# Patient Record
Sex: Male | Born: 1960
Health system: Southern US, Community
[De-identification: ages and names within clinical notes are randomized; demographics above are authoritative.]

## PROBLEM LIST (undated history)

## (undated) DIAGNOSIS — I10 Essential (primary) hypertension: Secondary | ICD-10-CM

## (undated) DIAGNOSIS — E039 Hypothyroidism, unspecified: Secondary | ICD-10-CM

## (undated) DIAGNOSIS — Z87442 Personal history of urinary calculi: Secondary | ICD-10-CM

## (undated) DIAGNOSIS — K5090 Crohn's disease, unspecified, without complications: Secondary | ICD-10-CM

## (undated) HISTORY — DX: Hypothyroidism, unspecified: E03.9

## (undated) HISTORY — DX: Crohn's disease, unspecified, without complications: K50.90

## (undated) HISTORY — PX: BOWEL RESECTION: SHX1257

## (undated) HISTORY — DX: Essential (primary) hypertension: I10

## (undated) HISTORY — PX: LAPAROSCOPIC LYSIS INTESTINAL ADHESIONS: SUR778

---

## 2010-01-30 ENCOUNTER — Ambulatory Visit: Payer: Self-pay | Admitting: Family Medicine

## 2010-01-30 DIAGNOSIS — I10 Essential (primary) hypertension: Secondary | ICD-10-CM | POA: Insufficient documentation

## 2010-01-30 DIAGNOSIS — E039 Hypothyroidism, unspecified: Secondary | ICD-10-CM

## 2010-01-30 DIAGNOSIS — K5090 Crohn's disease, unspecified, without complications: Secondary | ICD-10-CM

## 2010-01-30 HISTORY — DX: Hypothyroidism, unspecified: E03.9

## 2010-01-30 HISTORY — DX: Essential (primary) hypertension: I10

## 2010-01-30 HISTORY — DX: Crohn's disease, unspecified, without complications: K50.90

## 2010-01-30 LAB — CONVERTED CEMR LAB
Benzodiazepines.: NEGATIVE
Creatinine,U: 68.9 mg/dL
Hep B Core Total Ab: NEGATIVE
Methadone: NEGATIVE
Propoxyphene: NEGATIVE

## 2010-02-06 ENCOUNTER — Telehealth: Payer: Self-pay | Admitting: Family Medicine

## 2010-02-21 ENCOUNTER — Telehealth: Payer: Self-pay | Admitting: Family Medicine

## 2010-02-24 ENCOUNTER — Ambulatory Visit: Payer: Self-pay | Admitting: Family Medicine

## 2010-02-24 LAB — CONVERTED CEMR LAB
ALT: 18 units/L (ref 0–53)
AST: 23 units/L (ref 0–37)
Albumin: 4 g/dL (ref 3.5–5.2)
BUN: 7 mg/dL (ref 6–23)
Bilirubin Urine: NEGATIVE
Chloride: 107 meq/L (ref 96–112)
Cholesterol: 157 mg/dL (ref 0–200)
Eosinophils Relative: 1.8 % (ref 0.0–5.0)
Glucose, Bld: 89 mg/dL (ref 70–99)
Glucose, Urine, Semiquant: NEGATIVE
HCT: 42.7 % (ref 39.0–52.0)
Hemoglobin: 14.3 g/dL (ref 13.0–17.0)
Lymphs Abs: 0.7 10*3/uL (ref 0.7–4.0)
MCV: 96.9 fL (ref 78.0–100.0)
Monocytes Absolute: 0.4 10*3/uL (ref 0.1–1.0)
Neutro Abs: 3.4 10*3/uL (ref 1.4–7.7)
PSA: 0.34 ng/mL (ref 0.10–4.00)
Platelets: 218 10*3/uL (ref 150.0–400.0)
Potassium: 4.5 meq/L (ref 3.5–5.1)
RDW: 12.6 % (ref 11.5–14.6)
Sodium: 143 meq/L (ref 135–145)
TSH: 0.97 microintl units/mL (ref 0.35–5.50)
Total Bilirubin: 0.8 mg/dL (ref 0.3–1.2)
WBC: 4.6 10*3/uL (ref 4.5–10.5)
pH: 5

## 2010-03-06 ENCOUNTER — Ambulatory Visit: Payer: Self-pay | Admitting: Family Medicine

## 2010-03-13 ENCOUNTER — Telehealth: Payer: Self-pay | Admitting: Family Medicine

## 2010-06-04 ENCOUNTER — Telehealth: Payer: Self-pay | Admitting: Family Medicine

## 2011-01-20 NOTE — Assessment & Plan Note (Signed)
Summary: TO BE EST/PRE-EMPLOYMENT CPX/NJR   Vital Signs:  Patient profile:   50 year old male Height:      71.75 inches Weight:      205 pounds BMI:     28.10 Temp:     98.2 degrees F oral Pulse rate:   60 / minute Pulse rhythm:   regular Resp:     12 per minute BP sitting:   120 / 84  (left arm) Cuff size:   regular  Vitals Entered By: Nira Conn LPN (January 30, 7370 11:24 AM) CC: New to establish, pre-employment physical  Vision Screening:Left eye w/o correction: 20 / 20 Right Eye w/o correction: 20 / 20 Both eyes w/o correction:  20/ 20  Color vision testing: normal      Vision Entered By: Nira Conn LPN (January 30, 625 11:44 AM)   History of Present Illness: Patient here to establish care. Needs preemployment physical. 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  recently moved here from [place].  Past medical history reviewed. History of Crohn's disease with 3 prior surgeries back in the 1990s. Disease been very stable on Imuran and Asacol. Has not yet established with a gastroenterologist here. Last colonoscopy 2010. Occasional loose stools, no recent diarrhea. Weight and appetite stable.  Also history of hypertension treated with Diovan HCTZ. This is been stable.  Hypothyroidism treated with generic thyroxine 50 micrograms daily.  Family history reviewed significant for father having type 2 diabetes, hypertension, chronic kidney disease related to his diabetes and heart disease in his late 41s. Mother had breast cancer  Patient smokes about half-pack cigarettes per day. Rare alcohol use. 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 Current Smoker Alcohol use-yes Regular exercise-no  Risk Factors: Exercise: no (01/30/2010)  Risk Factors: Smoking Status: current (01/30/2010) Packs/Day: 0.75 (01/30/2010)  Past Medical History: Hypertension Hypothyroid Crohn's diasease  Past Surgical History: Bowel resection for Crohn's disease X 3, 1992, 1995  Family History: Family History of Arthritis Breast cancer, mother Heart disease, father Family History Hypertension, COPD, Emphasema, father Diabetes, father, type ll  Social History: 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    Physical Exam  General:  Well-developed,well-nourished,in no acute distress; alert,appropriate and cooperative throughout examination Eyes:  No corneal or conjunctival inflammation noted. EOMI. Perrla. Funduscopic exam benign, without hemorrhages, exudates or papilledema. Vision grossly normal. Ears:  moderate cerumen left canal otherwise clear Nose:  External nasal examination shows no deformity or inflammation. Nasal mucosa are pink and moist without lesions or exudates. Mouth:  Oral mucosa and oropharynx without lesions or exudates.  Teeth in good repair. Neck:  No deformities, masses, or  tenderness noted. Lungs:  Normal respiratory effort, chest expands  symmetrically. Lungs are clear to auscultation, no crackles or wheezes. Heart:  Normal rate and regular rhythm. S1 and S2 normal without gallop, murmur, click, rub or other extra sounds. Abdomen:  scars from previous surgery. Normal bowel sounds. Soft and nontender without mass. Msk:  No deformity or scoliosis noted of thoracic or lumbar spine.   Extremities:  No clubbing, cyanosis, edema, or deformity noted with normal full range of motion of all joints.   Neurologic:  No cranial nerve deficits noted. Station and gait are normal. Plantar reflexes are down-going bilaterally. DTRs are symmetrical throughout. Sensory, motor and coordinative functions appear intact. Skin:  Intact without suspicious lesions or rashes   Impression & Recommendations:  Problem # 1:  CROHN'S DISEASE (ICD-555.9)  Problem # 2:  HYPERTENSION (ICD-401.9)  His updated medication list for this problem includes:    Diovan Hct 160-12.5 Mg Tabs (Valsartan-hydrochlorothiazide) ..... Once daily  Problem # 3:  HYPOTHYROIDISM (ICD-244.9)  His updated medication list for this problem includes:    Levothroid 50 Mcg Tabs (Levothyroxine sodium) ..... Once daily  Problem # 4:  PHYSICAL EXAMINATION (ICD-V70.0)  for preemployment physical.   patient needs urine drug screen and proof of prior hepatitis B immunization status  Orders: Work Related Pre Emp (20947) T-Hepatitis B Core Antibody ([id number]) T- * Misc. Laboratory test [phone])  Complete Medication List: 1)  Diovan Hct 160-12.5 Mg Tabs (Valsartan-hydrochlorothiazide) .... Once daily 2)  Asacol 400 Mg Tbec (Mesalamine) .... Once daily 3)  Levothroid 50 Mcg Tabs (Levothyroxine sodium) .... Once daily 4)  Azasan 100 Mg Tabs (Azathioprine) .... Once daily  Patient Instructions: 1)  Schedule complete physical exam at some point later this year

## 2011-01-20 NOTE — Progress Notes (Signed)
Summary: directions question re Asacol med  Phone Note From Pharmacy   Caller: Jesus Mccann pt Pharmacy Call For: [name]  Summary of Call: Faxed note from pt pharmacy Patient says he sometimes takes more of the Asacol 400 mg, 2 tabs by mouth two times a day  Would you like to change directions/quantity? Initial call taken by: Nira Conn LPN,  June 04, 1061 6:94 PM  Follow-up for Phone Call        Yes.  Change directions to "use as directed".  He can titrate as he has in past as needed flareups.  We can change quantity if needed but 360 should be adequate. Follow-up by: Carolann Littler MD,  June 04, 2010 5:36 PM    New/Updated Medications: ASACOL 400 MG TBEC (MESALAMINE) use as directed Prescriptions: ASACOL 400 MG TBEC (MESALAMINE) use as directed  #360 x 2   Entered by:   Nira Conn LPN   Authorized by:   Carolann Littler MD   Signed by:   Nira Conn LPN on 85/46/2703   Method used:   Electronically to        [facility] (retail)       [address]       [place]       [place], [place]  [address]       Ph: [phone]       Fax: [phone]   RxID:   [id number]

## 2011-01-20 NOTE — Progress Notes (Signed)
Summary: Letter for new employer request  Phone Note Call from Patient   Caller: Spouse Colleen Call For: Carolann Littler MD Summary of Call: Pt wife requesting a letter for pt new employer, would like today of possible. Initial call taken by: Nira Conn LPN,  February 06, 8501 8:16 AM  Follow-up for Phone Call        written Follow-up by: Carolann Littler MD,  February 06, 2010 8:32 AM  Additional Follow-up for Phone Call Additional follow up Details #1::        Pt wife notified letter is ready for pick-up, given to wife [place]. Additional Follow-up by: Nira Conn LPN,  February 07, 7740 8:55 AM

## 2011-01-20 NOTE — Progress Notes (Signed)
Summary: Levothroid refill X 1 year  Phone Note Call from Patient   Caller: Spouse Call For: Carolann Littler MD Summary of Call: Wife requesting refill of Thyroid med X 2 months.  Pt has labs scheduled for 3/7, CPX 3/16 Initial call taken by: Nira Conn LPN,  February 21, 2702 1:25 PM    Prescriptions: LEVOTHROID 50 MCG TABS (LEVOTHYROXINE SODIUM) once daily  #30 x 11   Entered by:   Nira Conn LPN   Authorized by:   Carolann Littler MD   Signed by:   Nira Conn LPN on 50/08/3817   Method used:   Electronically to        [facility] (retail)       [phone]       [place], [place]  [address]       Ph: [phone]       Fax: [phone]   RxID:   [id number]

## 2011-01-20 NOTE — Assessment & Plan Note (Signed)
Summary: CPX/RCD   Vital Signs:  Patient profile:   50 year old male Height:      71.75 inches Weight:      212 pounds Temp:     97.8 degrees F oral BP sitting:   110 / 82  (left arm) Cuff size:   regular  Vitals Entered By: Nira Conn LPN (March 06, 174 1:02 AM) CC: CPX, labs done   History of Present Illness: Here for complete physical examination. Past medical history again reviewed. History of hypothyroidism, Crohn's disease, and hypertension. Needs refill of medications today.  Family history and social history reviewed and no signif. changes.  Date of last tetanus unknown. Colonoscopy 2010. Patient smokes about half-pack cigarettes per day or less. No regular alcohol use.    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 Regular exercise-no  Risk Factors: Exercise: no (01/30/2010)  Risk Factors: Smoking Status: current (01/30/2010) Packs/Day: 0.75 (01/30/2010) PMH-FH-SH reviewed for relevance  Review of Systems  The patient denies anorexia, fever, weight loss, weight gain, vision loss, decreased hearing, hoarseness, chest pain, syncope, dyspnea on exertion, peripheral edema, prolonged cough, headaches, hemoptysis, abdominal pain, melena, hematochezia, severe indigestion/heartburn, hematuria, incontinence, genital sores, muscle weakness, suspicious skin lesions, difficulty walking, depression, unusual weight change, abnormal  bleeding, enlarged lymph nodes, and testicular masses.    Physical Exam  General:  Well-developed,well-nourished,in no acute distress; alert,appropriate and cooperative throughout examination Head:  Normocephalic and atraumatic without obvious abnormalities. No apparent alopecia or balding. Eyes:  No corneal or conjunctival inflammation noted. EOMI. Perrla. Funduscopic exam benign, without hemorrhages, exudates or papilledema. Vision grossly normal. Ears:  moderate cerumen left canal otherwise normal Mouth:  Oral mucosa and oropharynx without lesions or exudates.  Teeth in good repair. Neck:  No deformities, masses, or tenderness noted. Lungs:  Normal respiratory effort, chest expands symmetrically. Lungs are clear to auscultation, no crackles or wheezes. Heart:  Normal rate and regular rhythm. S1 and S2 normal without gallop, murmur, click, rub or other extra sounds. Abdomen:  scars lower abdomen from prior surgery. No mass. Nontender. Rectal:  colonoscopy 2010 Neurologic:  alert & oriented X3, cranial nerves II-XII intact, and strength normal in all extremities.   Psych:  normally interactive, good eye contact, not anxious appearing, and not depressed appearing.     Impression & Recommendations:  Problem # 1:  PHYSICAL EXAMINATION (ICD-V70.0) labs reviewed with patient and all favorable. Tdap Booster given. Colonoscopy 2010. Refill all medications.  Complete Medication List: 1)  Diovan Hct 160-12.5 Mg Tabs (Valsartan-hydrochlorothiazide) .... Once daily 2)  Asacol 400 Mg Tbec (Mesalamine) .... Two by mouth two times a day 3)  Levothroid 50 Mcg Tabs (Levothyroxine sodium) .... Once daily 4)  Azasan 100 Mg Tabs (Azathioprine) .... Once daily  Other Orders: Tdap => 33yr IM (([id number] Admin 1st Vaccine ((78242  Patient Instructions: 1)  Please schedule a follow-up appointment in 1 year.  2)  Stop smoking tips: Choose a quit date. Cut down before the quit date. Decide what you will do as  a substitute when you feel the urge to  smoke(gum, toothpick, exercise).  Prescriptions: AZASAN 100 MG TABS (AZATHIOPRINE) once daily  #90 x 3   Entered and Authorized by:   Carolann Littler MD   Signed by:   Carolann Littler MD on 03/06/2010   Method used:   Electronically to        [facility] (retail)       [address] [place], [place]  [address]       Ph: [phone]       Fax: [phone]   RxID:   [id number] LEVOTHROID 50 MCG TABS (LEVOTHYROXINE SODIUM) once daily  #90 x 3   Entered and Authorized by:   Carolann Littler MD   Signed by:   Carolann Littler MD on 03/06/2010   Method used:   Electronically to        [facility] (retail)       [address] [place], [place]  [address]       Ph: [phone]       Fax: [phone]   RxID:   [id number] ASACOL 400 MG TBEC (MESALAMINE) two by mouth two times a day  #360 x 3   Entered and Authorized by:   Carolann Littler MD   Signed by:   Carolann Littler MD on 03/06/2010   Method used:   Electronically to        [facility] (retail)       [address]       [place], [place]  [address]       Ph: [phone]       Fax: [phone]   RxID:   [id number] DIOVAN HCT 160-12.5 MG TABS (VALSARTAN-HYDROCHLOROTHIAZIDE) once daily  #90 x 3   Entered and Authorized by:   Carolann Littler MD   Signed by:   Carolann Littler MD on 03/06/2010   Method used:   Electronically to        [facility] (retail)       [address]       [place], [place]  [address]       Ph: [phone]       Fax: [phone]   RxID:   [id number]    Immunizations Administered:  Tetanus Vaccine:    Vaccine Type: Tdap    Site: left deltoid    Mfr: GlaxoSmithKline    Dose: 0.5 ml    Route: IM    Given by:  Nira Conn LPN    Exp. Date: 03/13/2012    Lot #: [id number]

## 2011-01-20 NOTE — Progress Notes (Signed)
Summary: Can [facility] switch med?  Phone Note From Pharmacy   Caller: [place] Call For: Jesus Mccann  Summary of Call: Pharmacy faxed note to report they received new Rx for Azasan 127m dor pt.  They spoke with pt and he said he previousely took Azathomine 570m 2 tabs daily and on our insurance the difference for 90 days Azasan 10015m#90 = $60.00 Azathioprine 37m6m80 = $14.00  Can they change med to Azathioprine? Initial call taken by: NancNira Conn,  March 24, [id number] AM  Follow-up for Phone Call        OK to change. Follow-up by: BrucCarolann Littler  March 13, 2010 8:31 AM  Additional Follow-up for Phone Call Additional follow up Details #1::        Rx changed to Azathioprine 50, one tab two times a day, pharmacy informed Additional Follow-up by: NancNira Conn,  March 24, 201101105 AM    New/Updated Medications: AZATHIOPRINE 50 MG TABS (AZATHIOPRINE) one tab two times a day Prescriptions: AZATHIOPRINE 50 MG TABS (AZATHIOPRINE) one tab two times a day  #180 x 3   Entered by:   NancNira Conn   Authorized by:   BrucCarolann Littler  Signed by:   NancNira Conn on 03/203/49/6116ethod used:   Electronically to        [facility])       [address]    [address]  [id number]   Ph: [phone]   Fax: [phone]:   [id number]

## 2011-03-19 ENCOUNTER — Encounter: Payer: Self-pay | Admitting: Family Medicine

## 2011-03-23 ENCOUNTER — Encounter: Payer: Self-pay | Admitting: Family Medicine

## 2011-03-23 ENCOUNTER — Ambulatory Visit (INDEPENDENT_AMBULATORY_CARE_PROVIDER_SITE_OTHER): Payer: Commercial Managed Care - PPO | Admitting: Family Medicine

## 2011-03-23 DIAGNOSIS — E039 Hypothyroidism, unspecified: Secondary | ICD-10-CM

## 2011-03-23 DIAGNOSIS — I10 Essential (primary) hypertension: Secondary | ICD-10-CM

## 2011-03-23 DIAGNOSIS — Z0000 Encounter for general adult medical examination without abnormal findings: Secondary | ICD-10-CM

## 2011-03-23 DIAGNOSIS — K5090 Crohn's disease, unspecified, without complications: Secondary | ICD-10-CM

## 2011-03-23 LAB — LIPID PANEL
Cholesterol: 135 mg/dL (ref 0–200)
LDL Cholesterol: 78 mg/dL (ref 0–99)
Total CHOL/HDL Ratio: 3
VLDL: 14.8 mg/dL (ref 0.0–40.0)

## 2011-03-23 MED ORDER — MESALAMINE 400 MG PO TBEC: 400.0000 mg | DELAYED_RELEASE_TABLET | Freq: Four times a day (QID) | ORAL | Status: DC

## 2011-03-23 MED ORDER — AZATHIOPRINE 50 MG PO TABS: 50.0000 mg | ORAL_TABLET | Freq: Two times a day (BID) | ORAL | Status: DC

## 2011-03-23 MED ORDER — VALSARTAN-HYDROCHLOROTHIAZIDE 160-12.5 MG PO TABS: 1.0000 | ORAL_TABLET | Freq: Every day | ORAL | Status: DC

## 2011-03-23 MED ORDER — LEVOTHYROXINE SODIUM 50 MCG PO TABS: 50.0000 ug | ORAL_TABLET | Freq: Every day | ORAL | Status: DC

## 2011-03-23 NOTE — Progress Notes (Signed)
  Subjective:    Patient ID: Jesus Mccann, male    DOB: 1961/03/29, 50 y.o.   MRN: [id number]  HPI Patient here for complete physical. Past medical history reviewed. History of hypothyroidism, Crohn's disease, and hypertension. Medications reviewed. Compliant with all. Needs refills of all medications today. Crohn's has been stable with no recent weight loss, change of appetite, diarrhea, abdominal pain.  He has yet to establish with gastroenterologist here.  No fatigue, constipation, cold intolerance or any other overt symptoms of hypothyroidism.  Hypertension treated with Diovan.  No dizziness or headaches.  Recent screenings through work. Audiometry was mild hearing loss left ear. EKG unremarkable. CBC and chemistries unremarkable. Patient also had chest x-ray which showed no active disease per report. These studies were all reviewed.  Tetanus last year. Last colonoscopy he thinks 3 years ago with five-year followup recommended.   Review of Systems  Constitutional: Negative for fever, activity change, appetite change and fatigue.  HENT: Negative for ear pain, congestion and trouble swallowing.   Eyes: Negative for pain and visual disturbance.  Respiratory: Negative for cough, shortness of breath and wheezing.   Cardiovascular: Negative for chest pain and palpitations.  Gastrointestinal: Negative for nausea, vomiting, abdominal pain, diarrhea, constipation, blood in stool, abdominal distention and rectal pain.  Genitourinary: Negative for dysuria, hematuria and testicular pain.  Musculoskeletal: Negative for joint swelling and arthralgias.  Skin: Negative for rash.  Neurological: Negative for dizziness, syncope and headaches.  Hematological: Negative for adenopathy.  Psychiatric/Behavioral: Negative for confusion and dysphoric mood.       Objective:   Physical Exam  Constitutional: He is oriented to person, place, and time. He appears well-developed and well-nourished. No distress.    HENT:  Head: Normocephalic and atraumatic.  Right Ear: External ear normal.  Left Ear: External ear normal.  Mouth/Throat: Oropharynx is clear and moist.  Eyes: Conjunctivae and EOM are normal. Pupils are equal, round, and reactive to light.  Neck: Normal range of motion. Neck supple. No thyromegaly present.  Cardiovascular: Normal rate, regular rhythm and normal heart sounds.   No murmur heard. Pulmonary/Chest: No respiratory distress. He has no wheezes. He has no rales.  Abdominal: Soft. Bowel sounds are normal. He exhibits no distension and no mass. There is no tenderness. There is no rebound and no guarding.  Genitourinary: Rectum normal and prostate normal.  Musculoskeletal: He exhibits no edema.  Lymphadenopathy:    He has no cervical adenopathy.  Neurological: He is alert and oriented to person, place, and time. He displays normal reflexes. No cranial nerve deficit.  Skin: No rash noted.  Psychiatric: He has a normal mood and affect.          Assessment & Plan:  #1 wellness visit. TSH, lipid panel, and PSA ordered. Determine date of last colonoscopy. Will likely need referral back next year #2 hypothyroidism #3 Crohn's disease #4 hypertension

## 2011-03-24 ENCOUNTER — Encounter: Payer: Self-pay | Admitting: Family Medicine

## 2011-03-24 NOTE — Progress Notes (Signed)
Quick Note:  Pt informed, labs printed and given to wife as requested ______

## 2011-12-02 ENCOUNTER — Ambulatory Visit: Payer: Commercial Managed Care - PPO | Admitting: Family Medicine

## 2011-12-03 ENCOUNTER — Ambulatory Visit: Payer: Commercial Managed Care - PPO | Admitting: Family Medicine

## 2012-01-25 ENCOUNTER — Encounter: Payer: Self-pay | Admitting: Family Medicine

## 2012-01-25 ENCOUNTER — Ambulatory Visit (INDEPENDENT_AMBULATORY_CARE_PROVIDER_SITE_OTHER): Payer: PRIVATE HEALTH INSURANCE | Admitting: Family Medicine

## 2012-01-25 VITALS — BP 120/80 | Temp 97.7°F | Wt 201.0 lb

## 2012-01-25 DIAGNOSIS — G5602 Carpal tunnel syndrome, left upper limb: Secondary | ICD-10-CM

## 2012-01-25 DIAGNOSIS — G5600 Carpal tunnel syndrome, unspecified upper limb: Secondary | ICD-10-CM

## 2012-01-25 NOTE — Progress Notes (Signed)
  Subjective:    Patient ID: Jesus Mccann, male    DOB: [birth year], 51 y.o.   MRN: [id number]  HPI  Left hand and wrist pain. Onset several weeks ago. Does a lot of repetitive use with upper extremities. Right-hand dominant., Night pain. Intermittent numbness mostly from index and ring fingers. Occasional burning type pain. Started wrist splint 2 weeks ago and this helped some. Also taking Aleve. He takes immunosuppressants Imuran and Asacol already. Denies injury. Pain occasionally radiates to her elbow but not above. No neck pain. No clear weakness at this time   Review of Systems  Constitutional: Negative for fever and chills.  Neurological: Positive for numbness. Negative for weakness.  Hematological: Negative for adenopathy.       Objective:   Physical Exam  Constitutional: He appears well-developed and well-nourished.  Cardiovascular: Normal rate and regular rhythm.   Musculoskeletal:       Left wrist and hand are normal no swelling. No erythema. No warmth. Full range of motion left wrist. Normal distal pulses. Good capillary refill. Tinel sign is negative. No muscle atrophy. Left elbow full range of motion with no specific tenderness  Neurological:       Full strength left wrist and hand. Normal sensory function to touch. Deep reflexes are symmetric upper extremity          Assessment & Plan:  Probable carpal tunnel left wrist. Continue wrist splint. Consider corticosteroid injection if not improving. We elected not to start prednisone since he started taking Asacol and Imuran. Continue Aleve or Advil as needed. Consider nerve conduction testing if not improved over the next few weeks especially if he has any worsening symptoms or weakness

## 2012-01-25 NOTE — Patient Instructions (Signed)
Carpal Tunnel Syndrome The carpal tunnel is a narrow hollow area in the wrist. It is formed by the wrist bones and ligaments. Nerves, blood vessels, and tendons (cord like structures which attach muscle to bone) on the palm side (the side of your hand in the direction your fingers bend) of your hand pass through the carpal tunnel. Repeated wrist motion or certain diseases may cause swelling within the tunnel. (That is why these are called repetitive trauma (damage caused by over use) disorders. It is also a common problem in late pregnancy.) This swelling pinches the main nerve in the wrist (median nerve) and causes the painful condition called carpal tunnel syndrome. A feeling of "pins and needles" may be noticed in the fingers or hand; however, the entire arm may ache from this condition. Carpal tunnel syndrome may clear up by itself. Cortisone injections may help. Sometimes, an operation may be needed to free the pinched nerve. An electromyogram (a type of test) may be needed to confirm this diagnosis (learning what is wrong). This is a test which measures nerve conduction. The nerve conduction is usually slowed in a carpal tunnel syndrome. HOME CARE INSTRUCTIONS   If your caregiver prescribed medication to help reduce swelling, take as directed.   If you were given a splint to keep your wrist from bending, use it as instructed. It is important to wear the splint at night. Use the splint for as long as you have pain or numbness in your hand, arm or wrist. This may take 1 to 2 months.   If you have pain at night, it may help to rub or shake your hand, or elevate your hand above the level of your heart (the center of your chest).   It is important to give your wrist a rest by stopping the activities that are causing the problem. If your symptoms (problems) are work-related, you may need to talk to your employer about changing to a job that does not require using your wrist.   Only take over-the-counter  or prescription medicines for pain, discomfort, or fever as directed by your caregiver.   Following periods of extended use, particularly strenuous use, apply an ice pack wrapped in a towel to the anterior (palm) side of the affected wrist for 20 to 30 minutes. Repeat as needed three to four times per day. This will help reduce the swelling.   Follow all instructions for follow-up with your caregiver. This includes any orthopedic referrals, physical therapy, and rehabilitation. Any delay in obtaining necessary care could result in a delay or failure of your condition to heal.  SEEK IMMEDIATE MEDICAL CARE IF:   You are still having pain and numbness following a week of treatment.   You develop new, unexplained symptoms.   Your current symptoms are getting worse and are not helped or controlled with medications.  MAKE SURE YOU:   Understand these instructions.   Will watch your condition.   Will get help right away if you are not doing well or get worse.  Document Released: 12/04/2000 Document Revised: 08/19/2011 Document Reviewed: 07/11/2008 [facility] Patient Information 2012 [place].

## 2012-04-01 ENCOUNTER — Other Ambulatory Visit: Payer: Self-pay | Admitting: Family Medicine

## 2012-04-18 ENCOUNTER — Other Ambulatory Visit: Payer: Self-pay | Admitting: Family Medicine

## 2012-05-13 ENCOUNTER — Encounter: Payer: PRIVATE HEALTH INSURANCE | Admitting: Family Medicine

## 2012-06-03 ENCOUNTER — Other Ambulatory Visit: Payer: PRIVATE HEALTH INSURANCE

## 2012-06-10 ENCOUNTER — Encounter: Payer: PRIVATE HEALTH INSURANCE | Admitting: Family Medicine

## 2012-06-14 ENCOUNTER — Other Ambulatory Visit: Payer: Self-pay | Admitting: Family Medicine

## 2012-06-16 ENCOUNTER — Telehealth: Payer: Self-pay | Admitting: Family Medicine

## 2012-06-16 DIAGNOSIS — K5190 Ulcerative colitis, unspecified, without complications: Secondary | ICD-10-CM

## 2012-06-16 NOTE — Telephone Encounter (Signed)
I just filled this earlier from an E-Scribe

## 2012-06-16 NOTE — Telephone Encounter (Signed)
[facility] called and said that they no longer make ASACOL 400 MG EC tablet. What does pcp want to use an alternative?

## 2012-06-17 NOTE — Telephone Encounter (Signed)
[facility] called and said that they rcvd to escribe for Asacol, but this med is no longer avail. Need diff med.

## 2012-06-17 NOTE — Telephone Encounter (Signed)
I spoke with pharmacist at [facility], one alternative is Delzicol, and it would be $100.00 co-pay per month for pt.  Pt wife said that would not be acceptable.  Explained Dr Elease Hashimoto is out of the office, returning Monday.

## 2012-06-21 NOTE — Telephone Encounter (Signed)
Pt needs to establish with local GI and will go ahead and refer.  They can discuss alternative medications.  He has previously been intolerant of azulfadine.

## 2012-06-27 ENCOUNTER — Encounter: Payer: Self-pay | Admitting: Gastroenterology

## 2012-07-04 ENCOUNTER — Telehealth: Payer: Self-pay | Admitting: *Deleted

## 2012-07-04 MED ORDER — MESALAMINE 800 MG PO TBEC: 800.0000 mg | DELAYED_RELEASE_TABLET | Freq: Two times a day (BID) | ORAL | Status: DC

## 2012-07-04 NOTE — Telephone Encounter (Signed)
Asacol HD no longer being made in 400 mg dose, however 800 mg is available.  Requesting change to Asacol HD 800 mg BID.  [facility] confirmed availability.  Per verbal with Dr Elease Hashimoto, [place] to make change

## 2012-07-18 ENCOUNTER — Telehealth: Payer: Self-pay | Admitting: *Deleted

## 2012-07-18 ENCOUNTER — Ambulatory Visit (AMBULATORY_SURGERY_CENTER): Payer: PRIVATE HEALTH INSURANCE | Admitting: *Deleted

## 2012-07-18 VITALS — Ht 72.0 in | Wt 202.0 lb

## 2012-07-18 DIAGNOSIS — K5090 Crohn's disease, unspecified, without complications: Secondary | ICD-10-CM

## 2012-07-18 DIAGNOSIS — Z1211 Encounter for screening for malignant neoplasm of colon: Secondary | ICD-10-CM

## 2012-07-18 MED ORDER — MOVIPREP 100 G PO SOLR: ORAL | Status: DC

## 2012-07-18 NOTE — Telephone Encounter (Signed)
Patient

## 2012-07-18 NOTE — Progress Notes (Signed)
Patient last colonoscopy 3-5 years ago in [place]. Release of information filled out and placed on Jesus Mccann's desk. Also phone note sent to Cynthia,RN. Patient has history of crohn's and history of polyps per patient, he does not know when recall should be. He states his primary doctor (Dr.Burchette) wants him to have colonoscopy. Patient denies any current GI problems or symptoms. He currently on medications for crohn's.

## 2012-07-18 NOTE — Telephone Encounter (Signed)
Jesus Mccann had previous colonoscopy 3-5 years ago in [place] for history of crohn's and history of polyps per Jesus Mccann. Release of information filled out and signed by Jesus Mccann. Placed on Jesus Mccann's desk. Jesus Mccann not sure when his recall colonoscopy should be, his primary doctor(JesusBurchette) wants him to have colonoscopy and he is prescribing his crohn's medications. Jesus Mccann denies any GI problems or concerns.  Thanks.

## 2012-07-21 NOTE — Telephone Encounter (Signed)
Faxed release of info sheet to Dr Auteri's ofc, [phone] fax, ph [phone]

## 2012-07-28 ENCOUNTER — Telehealth: Payer: Self-pay | Admitting: Gastroenterology

## 2012-07-28 NOTE — Telephone Encounter (Signed)
Informed wife that I faxed the release sheet and haven't seen a response; will resend and call her back.

## 2012-07-28 NOTE — Telephone Encounter (Signed)
Informed pt's wife I have not received any info; will call in am. She stated pt's COLON was normal.

## 2012-07-29 NOTE — Telephone Encounter (Signed)
Finally received a fax back from [facility] that they have no records for pt. Researched Google for more info on Dr Allyson Sabal and found another practice, [facility] [phone] . I have left a message for Med Rec at to call if pt was seen there.  Left message for wife about the situation. Spoke with Dr Sharlett Iles who states it's OK to go ahead with the procedure.

## 2012-07-29 NOTE — Telephone Encounter (Signed)
refaxed info request again.

## 2012-07-29 NOTE — Telephone Encounter (Signed)
Informed pt's wife that we will go ahead with the COLON. I then received the info from [facility] and pt has had a resection d/t Colitis. Dr Sharlett Iles, I left the notes at your computer in [facility]. Thanks.

## 2012-07-29 NOTE — Telephone Encounter (Signed)
lmom for Med Rec at [facility] for someone to call me back, ph [phone], fax [phone]; I still haven't received any info on pt's COLON.

## 2012-08-01 ENCOUNTER — Ambulatory Visit (AMBULATORY_SURGERY_CENTER): Payer: PRIVATE HEALTH INSURANCE | Admitting: Gastroenterology

## 2012-08-01 ENCOUNTER — Encounter: Payer: Self-pay | Admitting: Gastroenterology

## 2012-08-01 VITALS — BP 122/94 | HR 59 | Temp 96.0°F | Resp 18 | Ht 72.0 in | Wt 202.0 lb

## 2012-08-01 DIAGNOSIS — K5090 Crohn's disease, unspecified, without complications: Secondary | ICD-10-CM

## 2012-08-01 DIAGNOSIS — Z1211 Encounter for screening for malignant neoplasm of colon: Secondary | ICD-10-CM

## 2012-08-01 DIAGNOSIS — Z86010 Personal history of colonic polyps: Secondary | ICD-10-CM

## 2012-08-01 MED ORDER — SODIUM CHLORIDE 0.9 % IV SOLN: 500.0000 mL | INTRAVENOUS | Status: DC

## 2012-08-01 NOTE — Progress Notes (Signed)
Patient did not have preoperative order for IV antibiotic SSI prophylaxis. (G8918)  Patient did not experience any of the following events: a burn prior to discharge; a fall within the facility; wrong site/side/patient/procedure/implant event; or a hospital transfer or hospital admission upon discharge from the facility. (G8907)  

## 2012-08-01 NOTE — Patient Instructions (Addendum)
YOU HAD AN ENDOSCOPIC PROCEDURE TODAY AT THE [facility]: Refer to the procedure report that was given to you for any specific questions about what was found during the examination.  If the procedure report does not answer your questions, please call your gastroenterologist to clarify.  If you requested that your care partner not be given the details of your procedure findings, then the procedure report has been included in a sealed envelope for you to review at your convenience later.  YOU SHOULD EXPECT: Some feelings of bloating in the abdomen. Passage of more gas than usual.  Walking can help get rid of the air that was put into your GI tract during the procedure and reduce the bloating. If you had a lower endoscopy (such as a colonoscopy or flexible sigmoidoscopy) you may notice spotting of blood in your stool or on the toilet paper. If you underwent a bowel prep for your procedure, then you may not have a normal bowel movement for a few days.  DIET: Your first meal following the procedure should be a light meal and then it is ok to progress to your normal diet.  A half-sandwich or bowl of soup is an example of a good first meal.  Heavy or fried foods are harder to digest and may make you feel nauseous or bloated.  Likewise meals heavy in dairy and vegetables can cause extra gas to form and this can also increase the bloating.  Drink plenty of fluids but you should avoid alcoholic beverages for 24 hours.  ACTIVITY: Your care partner should take you home directly after the procedure.  You should plan to take it easy, moving slowly for the rest of the day.  You can resume normal activity the day after the procedure however you should NOT DRIVE or use heavy machinery for 24 hours (because of the sedation medicines used during the test).    SYMPTOMS TO REPORT IMMEDIATELY: A gastroenterologist can be reached at any hour.  During normal business hours, 8:30 AM to 5:00 PM Monday through Friday,  call [phone].  After hours and on weekends, please call the GI answering service at [phone] who will take a message and have the physician on call contact you.   Following lower endoscopy (colonoscopy or flexible sigmoidoscopy):  Excessive amounts of blood in the stool  Significant tenderness or worsening of abdominal pains  Swelling of the abdomen that is new, acute  Fever of 100F or higher    FOLLOW UP: If any biopsies were taken you will be contacted by phone or by letter within the next 1-3 weeks.  Call your gastroenterologist if you have not heard about the biopsies in 3 weeks.  Our staff will call the home number listed on your records the next business day following your procedure to check on you and address any questions or concerns that you may have at that time regarding the information given to you following your procedure. This is a courtesy call and so if there is no answer at the home number and we have not heard from you through the emergency physician on call, we will assume that you have returned to your regular daily activities without incident.  SIGNATURES/CONFIDENTIALITY: You and/or your care partner have signed paperwork which will be entered into your electronic medical record.  These signatures attest to the fact that that the information above on your After Visit Summary has been reviewed and is understood.  Full responsibility of the confidentiality   of this discharge information lies with you and/or your care-partner.     

## 2012-08-01 NOTE — Op Note (Signed)
[address] [place], [place]  [id number]  COLONOSCOPY PROCEDURE REPORT  PATIENT:  Jesus Mccann, Jesus Mccann  MR#:  [id number] BIRTHDATE:  [birth year], 76 yrs. old  GENDER:  male ENDOSCOPIST:  Maurizio Geno. Sharlett Iles, MD, [facility] REF. BY:  Carolann Littler, M.D. PROCEDURE DATE:  08/01/2012 PROCEDURE:  Colonoscopy 24097 ASA CLASS:  Class II INDICATIONS:  Crohn's disease, history of pre-cancerous (adenomatous) colon polyps prior right colon resection MEDICATIONS:   propofol (Diprivan) 250 mg IV  DESCRIPTION OF PROCEDURE:   After the risks and benefits and of the procedure were explained, informed consent was obtained. Digital rectal exam was performed and revealed no abnormalities. The LB CF-[id number] endoscope was introduced through the anus and advanced to the anastomosis.  The quality of the prep was poor, using MoviPrep.  The instrument was then slowly withdrawn as the colon was fully examined. <<PROCEDUREIMAGES>>  FINDINGS:  There was a surgical anastomosis. RIGHT HEMICOLECTOMY WITH WIDELY PATENT ANASTOMOSIS,,FEW ANANSOMOTIC EROSIONS NOTED,,, No polyps or cancers were seen.  This was otherwise a normal examination of the colon.   Retroflexed views in the rectum revealed no abnormalities.    The scope was then withdrawn from the patient and the procedure completed.  COMPLICATIONS:  None ENDOSCOPIC IMPRESSION: 1) Anastomosis 2) No polyps or cancers 3) Otherwise normal examination NO ACTIVE CROHN'S DISEASE,FISTULAE,ANAL DISEASE STRICTURES NOTED. RECOMMENDATIONS: 1) Repeat Colonoscopy in 5 years.  REPEAT EXAM:  No  ______________________________ Loralee Pacas. Sharlett Iles, MD, [facility]  CC:  n. eSIGNED:   Loralee Pacas. Paulett Kaufhold at 08/01/2012 09:03 AM  Rosalva Ferron, [phone]

## 2012-08-02 ENCOUNTER — Telehealth: Payer: Self-pay | Admitting: *Deleted

## 2012-08-02 NOTE — Telephone Encounter (Signed)
  Follow up Call-  Call back number 08/01/2012  Post procedure Call Back phone  #  [phone]  Permission to leave phone message Yes     Patient questions:  Do you have a fever, pain , or abdominal swelling? no Pain Score  0 *  Have you tolerated food without any problems? yes  Have you been able to return to your normal activities? yes  Do you have any questions about your discharge instructions: Diet   no Medications  no Follow up visit  no  Do you have questions or concerns about your Care? no  Actions: * If pain score is 4 or above: No action needed, pain <4.

## 2012-10-03 ENCOUNTER — Other Ambulatory Visit (INDEPENDENT_AMBULATORY_CARE_PROVIDER_SITE_OTHER): Payer: PRIVATE HEALTH INSURANCE

## 2012-10-03 DIAGNOSIS — Z0000 Encounter for general adult medical examination without abnormal findings: Secondary | ICD-10-CM

## 2012-10-03 LAB — HEPATIC FUNCTION PANEL
ALT: 13 U/L (ref 0–53)
AST: 23 U/L (ref 0–37)
Bilirubin, Direct: 0.1 mg/dL (ref 0.0–0.3)
Total Bilirubin: 1 mg/dL (ref 0.3–1.2)

## 2012-10-03 LAB — BASIC METABOLIC PANEL
BUN: 12 mg/dL (ref 6–23)
Calcium: 9.1 mg/dL (ref 8.4–10.5)
Creatinine, Ser: 1 mg/dL (ref 0.4–1.5)
GFR: 80.1 mL/min (ref >60.00)
Potassium: 4.6 mEq/L (ref 3.5–5.1)

## 2012-10-03 LAB — CBC WITH DIFFERENTIAL/PLATELET
Basophils Absolute: 0 10*3/uL (ref 0.0–0.1)
Eosinophils Relative: 2.9 % (ref 0.0–5.0)
Lymphocytes Relative: 12.9 % (ref 12.0–46.0)
Monocytes Relative: 10.8 % (ref 3.0–12.0)
Neutrophils Relative %: 73.1 % (ref 43.0–77.0)
Platelets: 202 10*3/uL (ref 150.0–400.0)
WBC: 5.4 10*3/uL (ref 4.5–10.5)

## 2012-10-03 LAB — LIPID PANEL
HDL: 46.3 mg/dL (ref >39.00)
Total CHOL/HDL Ratio: 3
Triglycerides: 49 mg/dL (ref 0.0–149.0)
VLDL: 9.8 mg/dL (ref 0.0–40.0)

## 2012-10-03 NOTE — Addendum Note (Signed)
Addended by: Joyce Gross R on: 10/03/2012 09:21 AM   Modules accepted: Orders

## 2012-10-10 ENCOUNTER — Ambulatory Visit (INDEPENDENT_AMBULATORY_CARE_PROVIDER_SITE_OTHER): Payer: PRIVATE HEALTH INSURANCE | Admitting: Family Medicine

## 2012-10-10 ENCOUNTER — Encounter: Payer: Self-pay | Admitting: Family Medicine

## 2012-10-10 VITALS — BP 102/72 | HR 72 | Temp 97.9°F | Resp 12 | Ht 72.5 in | Wt 201.0 lb

## 2012-10-10 DIAGNOSIS — Z0000 Encounter for general adult medical examination without abnormal findings: Secondary | ICD-10-CM

## 2012-10-10 MED ORDER — LEVOTHYROXINE SODIUM 50 MCG PO TABS: 50.0000 ug | ORAL_TABLET | Freq: Every day | ORAL | Status: DC

## 2012-10-10 NOTE — Patient Instructions (Addendum)
Consider yearly flu vaccine and one time pneumonia vaccine prior to age 51

## 2012-10-10 NOTE — Progress Notes (Signed)
  Subjective:    Patient ID: Jesus Mccann, male    DOB: Aug 18, 1961, 51 y.o.   MRN: [id number]  HPI  Complete physical. History of Crohn's disease, hypertension, and hypothyroidism. Recently saw gastroenterologist. Colonoscopy unremarkable. Patient remains on levothyroxin, asacol, valsartan HCTZ, and Imuran. Declines flu vaccine. No prior Pneumovax and declines this as well. Continues to smoke about half-pack cigarettes per day. Low motivation to quit.  Past Medical History  Diagnosis Date  . CROHN'S DISEASE 01/30/2010  . HYPERTENSION 01/30/2010  . HYPOTHYROIDISM 01/30/2010   Past Surgical History  Procedure Date  . Bowel resection 20 yrs ago    resection x2=hx crohns.  . Laparoscopic lysis intestinal adhesions     reports that he has been smoking Cigarettes.  He has a 3 pack-year smoking history. He has never used smokeless tobacco. He reports that he drinks alcohol. He reports that he does not use illicit drugs. family history includes COPD in his father; Cancer in his mother; and Diabetes in his father.  There is no history of Colon cancer. No Known Allergies   Review of Systems  Constitutional: Negative for fever, activity change, appetite change and fatigue.  HENT: Negative for ear pain, congestion and trouble swallowing.   Eyes: Negative for pain and visual disturbance.  Respiratory: Negative for cough, shortness of breath and wheezing.   Cardiovascular: Negative for chest pain and palpitations.  Gastrointestinal: Negative for nausea, vomiting, abdominal pain, diarrhea, constipation, blood in stool, abdominal distention and rectal pain.  Genitourinary: Negative for dysuria, hematuria and testicular pain.  Musculoskeletal: Negative for joint swelling and arthralgias.  Skin: Negative for rash.  Neurological: Negative for dizziness, syncope and headaches.  Hematological: Negative for adenopathy.  Psychiatric/Behavioral: Negative for confusion and dysphoric mood.         Objective:   Physical Exam  Constitutional: He is oriented to person, place, and time. He appears well-developed and well-nourished. No distress.  HENT:  Head: Normocephalic and atraumatic.  Right Ear: External ear normal.  Mouth/Throat: Oropharynx is clear and moist.       Cerumen impaction left canal  Eyes: Conjunctivae normal and EOM are normal. Pupils are equal, round, and reactive to light.  Neck: Normal range of motion. Neck supple. No thyromegaly present.  Cardiovascular: Normal rate, regular rhythm and normal heart sounds.   No murmur heard. Pulmonary/Chest: No respiratory distress. He has no wheezes. He has no rales.  Abdominal: Soft. Bowel sounds are normal. He exhibits no distension and no mass. There is no tenderness. There is no rebound and no guarding.       Scars abdominal wall from previous surgery but no masses  Genitourinary:       Deferred as recent colonoscopy.  Musculoskeletal: He exhibits no edema.  Lymphadenopathy:    He has no cervical adenopathy.  Neurological: He is alert and oriented to person, place, and time. He displays normal reflexes. No cranial nerve deficit.  Skin: No rash noted.  Psychiatric: He has a normal mood and affect.          Assessment & Plan:  Complete physical. Recent colonoscopy unremarkable. Pneumovax and flu vaccines recommended, particularly in view of immunosuppressant therapy and patient declines. Labs reviewed with patient. All favorable. Blood pressure well controlled.

## 2013-01-17 ENCOUNTER — Other Ambulatory Visit: Payer: Self-pay | Admitting: *Deleted

## 2013-01-17 MED ORDER — AZATHIOPRINE 50 MG PO TABS: 50.0000 mg | ORAL_TABLET | Freq: Two times a day (BID) | ORAL | Status: DC

## 2013-01-17 MED ORDER — MESALAMINE 800 MG PO TBEC: 800.0000 mg | DELAYED_RELEASE_TABLET | Freq: Two times a day (BID) | ORAL | Status: DC

## 2013-01-17 MED ORDER — LEVOTHYROXINE SODIUM 50 MCG PO TABS: 50.0000 ug | ORAL_TABLET | Freq: Every day | ORAL | Status: DC

## 2013-01-17 MED ORDER — VALSARTAN-HYDROCHLOROTHIAZIDE 160-12.5 MG PO TABS: 1.0000 | ORAL_TABLET | Freq: Every day | ORAL | Status: DC

## 2013-02-20 ENCOUNTER — Ambulatory Visit (INDEPENDENT_AMBULATORY_CARE_PROVIDER_SITE_OTHER): Payer: 59 | Admitting: Family Medicine

## 2013-02-20 ENCOUNTER — Encounter: Payer: Self-pay | Admitting: Family Medicine

## 2013-02-20 ENCOUNTER — Ambulatory Visit: Payer: PRIVATE HEALTH INSURANCE | Admitting: Family Medicine

## 2013-02-20 VITALS — BP 120/80 | Temp 98.0°F

## 2013-02-20 DIAGNOSIS — M7020 Olecranon bursitis, unspecified elbow: Secondary | ICD-10-CM

## 2013-02-20 DIAGNOSIS — M7021 Olecranon bursitis, right elbow: Secondary | ICD-10-CM

## 2013-02-20 NOTE — Patient Instructions (Addendum)
Olecranon Bursitis Bursitis is swelling and soreness (inflammation) of a fluid-filled sac (bursa) that covers and protects a joint. Olecranon bursitis occurs over the elbow.  CAUSES Bursitis can be caused by injury, overuse of the joint, arthritis, or infection.  SYMPTOMS   Tenderness, swelling, warmth, or redness over the elbow.  Elbow pain with movement. This is greater with bending the elbow.  Squeaking sound when the bursa is rubbed or moved.  Increasing size of the bursa without pain or discomfort.  Fever with increasing pain and swelling if the bursa becomes infected. HOME CARE INSTRUCTIONS   Put ice on the affected area.  Put ice in a plastic bag.  Place a towel between your skin and the bag.  Leave the ice on for 15 to 20 minutes each hour while awake. Do this for the first 2 days.  When resting, elevate your elbow above the level of your heart. This helps reduce swelling.  Continue to put the joint through a full range of motion 4 times per day. Rest the injured joint at other times. When the pain lessens, begin normal slow movements and usual activities.  Only take over-the-counter or prescription medicines for pain, discomfort, or fever as directed by your caregiver.  Reduce your intake of milk and related dairy products (cheese, yogurt). They may make your condition worse. SEEK IMMEDIATE MEDICAL CARE IF:   Your pain increases even during treatment.  You have a fever.  You have heat and inflammation over the bursa and elbow.  You have a red line that goes up your arm.  You have pain with movement of your elbow. MAKE SURE YOU:   Understand these instructions.  Will watch your condition.  Will get help right away if you are not doing well or get worse. Document Released: 01/06/2007 Document Revised: 02/29/2012 Document Reviewed: 11/22/2007 [facility] Patient Information 2013 [place].

## 2013-02-20 NOTE — Progress Notes (Signed)
  Subjective:    Patient ID: Jesus Mccann, male    DOB: September 10, 1961, 52 y.o.   MRN: [id number]  HPI Acute visit.  Right elbow edema. Fell 2 weeks ago. Minimal pain. No ecchymosis. Full range of motion. About one week ago developed edema right olecranon bursa. No erythema, warmth, fever, or chills. Patient tried heat, ice, Motrin without improvement   Review of Systems  Constitutional: Negative for fever and chills.  Neurological: Negative for weakness and numbness.       Objective:   Physical Exam  Constitutional: He appears well-developed and well-nourished.  Cardiovascular: Normal rate and regular rhythm.   Pulmonary/Chest: Effort normal and breath sounds normal. No respiratory distress. He has no wheezes. He has no rales.  Musculoskeletal:  Right elbow reveals swelling olecranon bursa. Full range of motion. No ecchymosis. No warmth. No erythema. No pain with supination or pronation          Assessment & Plan:  Olecranon bursitis right elbow secondary to trauma. No signs of secondary infection. No bony tenderness. Discussed risk and benefits of drainage. Patient consented to drainage.  skin prepped with Betadine. Using sterile technique and 21-gauge 1 inch needle withdrew 17-18 mm of thin serosanguineous discharge. No purulence. Patient tolerated well. Coban pressure dressing applied. . We explained that frequently effusions reoccur. . Followup promptly for any signs of secondary infection. Avoid pressure on elbow.

## 2013-10-05 ENCOUNTER — Encounter: Payer: Self-pay | Admitting: Family Medicine

## 2013-10-05 ENCOUNTER — Ambulatory Visit (INDEPENDENT_AMBULATORY_CARE_PROVIDER_SITE_OTHER): Payer: 59 | Admitting: Family Medicine

## 2013-10-05 VITALS — BP 110/80 | HR 56 | Temp 97.4°F | Wt 202.0 lb

## 2013-10-05 DIAGNOSIS — M67919 Unspecified disorder of synovium and tendon, unspecified shoulder: Secondary | ICD-10-CM

## 2013-10-05 DIAGNOSIS — M7581 Other shoulder lesions, right shoulder: Secondary | ICD-10-CM

## 2013-10-05 NOTE — Progress Notes (Signed)
  Subjective:    Patient ID: Jesus Mccann, male    DOB: 14-Nov-1961, 52 y.o.   MRN: [id number]  HPI Acute visit for right shoulder pain Onset about 2 weeks ago. He does not recall specific injury. He does recall pulling on a bungee cord which broke and he fell down but did not land on his shoulder. It is not clear that his pain started right after that.  He has tried Motrin and Aleve without improvement. Location is proximal deltoid region. Pain is worse with abduction and internal and external rotation. No definite weakness. No neck pain. No radiculopathy symptoms. No prior history of shoulder difficulty  Past Medical History  Diagnosis Date  . CROHN'S DISEASE 01/30/2010  . HYPERTENSION 01/30/2010  . HYPOTHYROIDISM 01/30/2010   Past Surgical History  Procedure Laterality Date  . Bowel resection  20 yrs ago    resection x2=hx crohns.  . Laparoscopic lysis intestinal adhesions      reports that he has been smoking Cigarettes.  He has a 3 pack-year smoking history. He has never used smokeless tobacco. He reports that he drinks alcohol. He reports that he does not use illicit drugs. family history includes COPD in his father; Cancer in his mother; Diabetes in his father. There is no history of Colon cancer. No Known Allergies    Review of Systems  Constitutional: Negative for fever and chills.  Neurological: Negative for weakness and numbness.       Objective:   Physical Exam  Constitutional: He appears well-developed and well-nourished.  Cardiovascular: Normal rate and regular rhythm.   Pulmonary/Chest: Effort normal and breath sounds normal. No respiratory distress. He has no wheezes. He has no rales.  Musculoskeletal:  Full range of motion cervical spine Right shoulder reveals no edema erythema or ecchymosis. No localized tenderness He has full range of motion right shoulder but does have some minimal pain with interpretation and mostly with abduction greater than 90   Neurological:  No detectable rotator cuff weakness. Symmetric upper extremity reflexes. No sensory impairment          Assessment & Plan:  Right shoulder pain. Suspect rotator cuff tendinitis. We explained partial small tear would be difficult to rule out clinically. He is already not responded to oral nonsteroidals. We offered corticosteroid injection which he wishes to proceed with. If not improving with that would need to consider x-rays and further evaluation.   Discussed risks and benefits of corticosteroid injection and patient consented.  After prepping skin with betadine, injected 40 mg depomedrol and 2 cc of plain xylocaine with 23 gauge one and one half inch needle using posterior lateral approach and pt tolerated well.

## 2013-10-05 NOTE — Patient Instructions (Signed)
Rotator Cuff Tendonitis  The rotator cuff is the collection of all the muscles and tendons (the supraspinatus, infraspinatus, subscapularis, and teres minor muscles and their tendons) that help your shoulder stay in place. This unit holds the head of the upper arm bone (humerus) in the cup (fossa) of the shoulder blade (scapula). Basically, it connects the arm to the shoulder. Tendinitis is a swelling and irritation of the tissue, called cord like structures (tendons) that connect muscle to bone. It usually is caused by overusing the joint involved. When the tissue surrounding a tendon (the synovium) becomes inflamed, it is called tenosynovitis. This also is often the result of overuse in people whose jobs require repetitive (over and over again) types of motion. HOME CARE INSTRUCTIONS   Use a sling or splint for as long as directed by your caregiver until the pain decreases.  Apply ice to the injury for 15-20 minutes, 3-4 times per day. Put the ice in a plastic bag and place a towel between the bag of ice and your skin.  Try to avoid use other than gentle range of motion while your shoulder is painful. Use and exercise only as directed by your caregiver. Stop exercises or range of motion if pain or discomfort increases, unless directed otherwise by your caregiver.  Only take over-the-counter or prescription medicines for pain, discomfort, or fever as directed by your caregiver.  If you were give a shoulder sling and straps (immobilizer), do not remove it except as directed, or until you see a caregiver for a follow-up examination. If you need to remove it, move your arm as little as possible or as directed.  You may want to sleep on several pillows at night to lessen swelling and pain. SEEK IMMEDIATE MEDICAL CARE IF:   Pain in your shoulder increases or new pain develops in your arm, hand, or fingers and is not relieved with medications.  You develop new, unexplained symptoms, especially  increased numbness in the hands or loss of strength, or you develop any worsening of the problems which brought you in for care.  Your arm, hand, or fingers are numb or tingling.  Your arm, hand, or fingers are swollen, painful, or turn white or blue. Document Released: 02/27/2004 Document Revised: 02/29/2012 Document Reviewed: 10/04/2008 [facility] Patient Information 2014 [place].

## 2013-10-26 ENCOUNTER — Encounter: Payer: Self-pay | Admitting: Family Medicine

## 2013-10-26 ENCOUNTER — Ambulatory Visit (INDEPENDENT_AMBULATORY_CARE_PROVIDER_SITE_OTHER): Payer: 59 | Admitting: Family Medicine

## 2013-10-26 VITALS — BP 104/70 | HR 72 | Temp 97.9°F | Wt 200.0 lb

## 2013-10-26 DIAGNOSIS — E039 Hypothyroidism, unspecified: Secondary | ICD-10-CM

## 2013-10-26 DIAGNOSIS — M25511 Pain in right shoulder: Secondary | ICD-10-CM

## 2013-10-26 DIAGNOSIS — M25519 Pain in unspecified shoulder: Secondary | ICD-10-CM

## 2013-10-26 LAB — TSH: TSH: 0.89 u[IU]/mL (ref 0.35–5.50)

## 2013-10-26 NOTE — Progress Notes (Signed)
  Subjective:    Patient ID: Jesus Mccann, male    DOB: November 17, 1961, 52 y.o.   MRN: [id number]  HPI Patient seen with persistent right shoulder pain. Refer to prior note. He was pulling on a bungee cord which broke. He fell backwards but did not land on his right shoulder. We suspected some rotator cuff tendinitis and explained we could rule out small tear last visit. We injected cortisone subacromial space and initially first couple days did not see much improvement. Approximately 50% better at this time. He was hoping for better response. He's not doing any stretching exercises. Still has considerable night pain at times.  No prior history of significant shoulder injury. No cervical neck pain.  Patient has hypothyroidism. He is due for repeat TSH. Compliant with therapy.  Past Medical History  Diagnosis Date  . CROHN'S DISEASE 01/30/2010  . HYPERTENSION 01/30/2010  . HYPOTHYROIDISM 01/30/2010   Past Surgical History  Procedure Laterality Date  . Bowel resection  20 yrs ago    resection x2=hx crohns.  . Laparoscopic lysis intestinal adhesions      reports that he has been smoking Cigarettes.  He has a 3 pack-year smoking history. He has never used smokeless tobacco. He reports that he drinks alcohol. He reports that he does not use illicit drugs. family history includes COPD in his father; Cancer in his mother; Diabetes in his father. There is no history of Colon cancer. No Known Allergies    Review of Systems  HENT: Negative.   Musculoskeletal: Negative for neck pain.  Neurological: Negative for numbness.       Objective:   Physical Exam  Constitutional: He appears well-developed and well-nourished.  Cardiovascular: Normal rate and regular rhythm.   Pulmonary/Chest: Effort normal and breath sounds normal. No respiratory distress. He has no wheezes. He has no rales.  Musculoskeletal:  Shoulders reveal no asymmetry and no muscle atrophy. He has full range of motion right  shoulder. Continues have some pain with abduction and internal rotation.  Neurological:  Has some mild weakness with external rotation right compared to left, o/w full strength.          Assessment & Plan:  #1 right shoulder pain. Rotator cuff syndrome. Approximately 50% improved following corticosteroid injection. Referral to sports medicine clinic for further evaluation. We've instructed in some range of motion exercises #2 hypothyroidism. Recheck TSH

## 2013-10-26 NOTE — Patient Instructions (Signed)
We will call you with Sports Medicine referral.

## 2013-10-30 ENCOUNTER — Ambulatory Visit (INDEPENDENT_AMBULATORY_CARE_PROVIDER_SITE_OTHER): Payer: 59 | Admitting: Family Medicine

## 2013-10-30 ENCOUNTER — Other Ambulatory Visit (INDEPENDENT_AMBULATORY_CARE_PROVIDER_SITE_OTHER): Payer: 59

## 2013-10-30 ENCOUNTER — Encounter: Payer: Self-pay | Admitting: Family Medicine

## 2013-10-30 VITALS — BP 118/74 | HR 68 | Wt 200.0 lb

## 2013-10-30 DIAGNOSIS — S46219A Strain of muscle, fascia and tendon of other parts of biceps, unspecified arm, initial encounter: Secondary | ICD-10-CM | POA: Insufficient documentation

## 2013-10-30 DIAGNOSIS — S43499A Other sprain of unspecified shoulder joint, initial encounter: Secondary | ICD-10-CM

## 2013-10-30 DIAGNOSIS — M25519 Pain in unspecified shoulder: Secondary | ICD-10-CM

## 2013-10-30 DIAGNOSIS — S46111A Strain of muscle, fascia and tendon of long head of biceps, right arm, initial encounter: Secondary | ICD-10-CM

## 2013-10-30 DIAGNOSIS — M25511 Pain in right shoulder: Secondary | ICD-10-CM

## 2013-10-30 DIAGNOSIS — M7551 Bursitis of right shoulder: Secondary | ICD-10-CM

## 2013-10-30 DIAGNOSIS — M7550 Bursitis of unspecified shoulder: Secondary | ICD-10-CM | POA: Insufficient documentation

## 2013-10-30 DIAGNOSIS — M75100 Unspecified rotator cuff tear or rupture of unspecified shoulder, not specified as traumatic: Secondary | ICD-10-CM

## 2013-10-30 MED ORDER — MELOXICAM 15 MG PO TABS: 15.0000 mg | ORAL_TABLET | Freq: Every day | ORAL | Status: DC

## 2013-10-30 MED ORDER — NITROGLYCERIN 0.2 MG/HR TD PT24: MEDICATED_PATCH | TRANSDERMAL | Status: DC

## 2013-10-30 NOTE — Assessment & Plan Note (Signed)
Right-sided. Mild in nature. Likely will get better after bicep tendon gets better. Home exercise program was given same modality should be consistent help with this problem as well. Patient did have an injection previously and likely would need another one inserted doing long-term anti-inflammatories. We'll reevaluate in 3-4 weeks followup.

## 2013-10-30 NOTE — Assessment & Plan Note (Signed)
Treatment, diagnosis and prognosis as well as rehabilitation discussed. Patient was given a home exercise program which he'll do on a daily basis. We discussed icing protocol. Patient does have Crohn's disease and we discussed trying to avoid anti-inflammatories. Patient states though that he is taking Aleve and would like to try something a little stronger. Meloxicam was given and warned of any potential side effects and to stop immediately. We also start a nitroglycerin and warned of potential side effects Patient will try these interventions and then come back again in 2-3 weeks. If he continues to have pain I would like to do an injection under ultrasound guidance of the bicep tendon.

## 2013-10-30 NOTE — Patient Instructions (Signed)
Very  Nice to meet you You have a long head of bicep tear in the tendon.  Meloxicam daily for 1 week then as needed.  STOP IF IT HURTS YOUR STOMACH Nitroglycerin Protocol   Apply 1/4 nitroglycerin patch to affected area at night.  Change position of patch within the affected area every 24 hours.  You may experience a headache during the first 1-2 weeks of using the patch, these should subside.  If you experience headaches after beginning nitroglycerin patch treatment, you may take your preferred over the counter pain reliever.  Another side effect of the nitroglycerin patch is skin irritation or rash related to patch adhesive.  Please notify our office if you develop more severe headaches or rash, and stop the patch.  Tendon healing with nitroglycerin patch may require 12 to 24 weeks depending on the extent of injury.  Men should not use if taking Viagra, Cialis, or Levitra.   Do not use if you have migraines or rosacea.   Exercises daily Ice 20 minutes 2 times a day Avoid any lifting over 30# Come back in 3 weeks. If still in pain will do injection.

## 2013-10-30 NOTE — Progress Notes (Signed)
I'm seeing this patient by the request  of:  Eulas Post, MD  CC:  Right shoulder pain HPI: Patient is a very pleasant 52 year old gentleman coming in with right shoulder pain. Patient has had this pain a little over a month now. Patient originally saw primary care provider N/A did do a subacromial injection. Patient states that he got approximately a 40% improvement with this. Patient desired or any true injury. Patient does have a somewhat physically demanding job. Patient notices the pain more with lifting as well as certain movements. Patient has only tried Tylenol and some range of motion exercises which has been minimally beneficial. Patient still states that he does have a intermittent sharp pain with certain movements that can also wake him up at night. Patient denies any weakness or numbness of the extremity. Patient states it does affect some of his activities of daily living and he would like to have this evaluated further. Patient is a severity of 7/10. Most of the pain seems to be associated on the anterior aspect of the shoulder the   Past medical, surgical, family and social history reviewed. Medications reviewed all in the electronic medical record.   Review of Systems: No headache, visual changes, nausea, vomiting, diarrhea, constipation, dizziness, abdominal pain, skin rash, fevers, chills, night sweats, weight loss, swollen lymph nodes, body aches, joint swelling, muscle aches, chest pain, shortness of breath, mood changes.   Objective:    .Blood pressure 118/74, pulse 68, weight 200 lb (90.719 kg), SpO2 98.00%.   General: No apparent distress alert and oriented x3 mood and affect normal, dressed appropriately.  HEENT: Pupils equal, extraocular movements intact Respiratory: Patient's speak in full sentences and does not appear short of breath Cardiovascular: No lower extremity edema, non tender, no erythema Skin: Warm dry intact with no signs of infection or rash on  extremities or on axial skeleton. Abdomen: Soft nontender Neuro: Cranial nerves II through XII are intact, neurovascularly intact in all extremities with 2+ DTRs and 2+ pulses. Lymph: No lymphadenopathy of posterior or anterior cervical chain or axillae bilaterally.  Gait normal with good balance and coordination.  MSK: Non tender with full range of motion and good stability and symmetric strength and tone of elbows, wrist, hip, knee and ankles bilaterally.  Shoulder: Right  Inspection reveals no abnormalities, atrophy or asymmetry. Palpation is normal with no tenderness over AC joint or bicipital groove. ROM is full in all planes. Patient does have pain though at forward flexion greater than 150. Rotator cuff strength normal throughout. Mild signs of impingement with negative Neer and Hawkin's tests, but negative empty can sign. Speeds and Yergason's tests are positive. No labral pathology noted with negative Obrien's, negative clunk and good stability. Normal scapular function observed. No painful arc and no drop arm sign. No apprehension sign  MSK US performed of: Right shoulder This study was ordered, performed, and interpreted by Charlann Boxer D.O.  Shoulder:   Supraspinatus:  Appears normal on long and transverse views, mild bursal bulge seen with shoulder abduction on impingement view. Infraspinatus:  Appears normal on long and transverse views. Mild bursal bulge noted in the tear appreciated. Subscapularis:  Appears normal on long and transverse views. Mild bursal bulge. This seems calcific. Teres Minor:  Appears normal on long and transverse views. AC joint:  Capsule undistended, no geyser sign. Glenohumeral Joint:  Appears normal without effusion. Glenoid Labrum:  Intact without visualized tears. Biceps Tendon:  A patient's long bicep tendon does have what appears to  be a tear approximately 5 cm distal to insertion on the rotator cuff. This appears to involve 25% of the tendon.  Appears to be approximately 2.5 cm in length. There is increasing Doppler flow.   Impression: Bicep tendon tear with mild subacromial bursitis    Impression and Recommendations:     This case required medical decision making of moderate complexity.

## 2013-11-23 ENCOUNTER — Encounter: Payer: Self-pay | Admitting: Family Medicine

## 2013-11-23 ENCOUNTER — Other Ambulatory Visit (INDEPENDENT_AMBULATORY_CARE_PROVIDER_SITE_OTHER): Payer: 59

## 2013-11-23 ENCOUNTER — Ambulatory Visit (INDEPENDENT_AMBULATORY_CARE_PROVIDER_SITE_OTHER): Payer: 59 | Admitting: Family Medicine

## 2013-11-23 VITALS — BP 114/68 | HR 54

## 2013-11-23 DIAGNOSIS — S43499A Other sprain of unspecified shoulder joint, initial encounter: Secondary | ICD-10-CM

## 2013-11-23 DIAGNOSIS — S46111A Strain of muscle, fascia and tendon of long head of biceps, right arm, initial encounter: Secondary | ICD-10-CM

## 2013-11-23 DIAGNOSIS — M75100 Unspecified rotator cuff tear or rupture of unspecified shoulder, not specified as traumatic: Secondary | ICD-10-CM

## 2013-11-23 DIAGNOSIS — M7551 Bursitis of right shoulder: Secondary | ICD-10-CM

## 2013-11-23 NOTE — Progress Notes (Signed)
Pre-visit discussion using our clinic review tool. No additional management support is needed unless otherwise documented below in the visit note.  

## 2013-11-23 NOTE — Assessment & Plan Note (Signed)
Injection as described above. Patient had near complete resolution of pain immediately. Given encouragement to do home exercises 3 times a week. Meloxicam daily for 5 days unless it hurts her stomach. Continued the nitroglycerin for 4 weeks Follow up in 4-6 weeks. Patient declined formal physical therapy.

## 2013-11-23 NOTE — Patient Instructions (Signed)
Do meloxicam daily for next 5 days Continue nitro at 1/2 patch  Exercises 3 times a week See me again in 4-6 weeks.

## 2013-11-23 NOTE — Progress Notes (Signed)
CC:  Followup right bicep tendon tear and subacromial bursitis. HPI: Patient is a right-hand-dominant male following up for right bicep tear and subacromial bursitis. Patient was seen one month ago and did decide on conservative therapy. Patient's was meloxicam even though he had Crohn's disease but this is that patient's wishes. We also did start nitroglycerin patch. Patient was given home exercise program as well. Patient states he is about 75% better. Patient still states that he has some discomfort usually in the morning. Patient states at work he does not notice the pain. Patient is using a half patch of a nitroglycerin without any side effects. Patient states that he took for meloxicam for about a day and a half and stated he did have improvement but then recently he has plateaued. Patient still states sometimes overhead activity gives him trouble.  Past medical, surgical, family and social history reviewed. Medications reviewed all in the electronic medical record.   Review of Systems: No headache, visual changes, nausea, vomiting, diarrhea, constipation, dizziness, abdominal pain, skin rash, fevers, chills, night sweats, weight loss, swollen lymph nodes, body aches, joint swelling, muscle aches, chest pain, shortness of breath, mood changes.   Objective:    .Blood pressure 114/68, pulse 54, SpO2 99.00%.   General: No apparent distress alert and oriented x3 mood and affect normal, dressed appropriately.  HEENT: Pupils equal, extraocular movements intact Respiratory: Patient's speak in full sentences and does not appear short of breath Cardiovascular: No lower extremity edema, non tender, no erythema Skin: Warm dry intact with no signs of infection or rash on extremities or on axial skeleton. Abdomen: Soft nontender Neuro: Cranial nerves II through XII are intact, neurovascularly intact in all extremities with 2+ DTRs and 2+ pulses. Lymph: No lymphadenopathy of posterior or anterior  cervical chain or axillae bilaterally.  Gait normal with good balance and coordination.  MSK: Non tender with full range of motion and good stability and symmetric strength and tone of elbows, wrist, hip, knee and ankles bilaterally.  Shoulder: Right  Inspection reveals no abnormalities, atrophy or asymmetry. Palpation is normal with no tenderness over AC joint or bicipital groove. ROM is full in all planes.  Rotator cuff strength normal throughout. Mild signs of impingement with negative Neer and Hawkin's tests, but negative empty can sign. Speeds and Yergason's tests negative No labral pathology noted with negative Obrien's, negative clunk and good stability. Normal scapular function observed. No painful arc and no drop arm sign. No apprehension sign  MSK US performed of: Right shoulder This study was ordered, performed, and interpreted by Charlann Boxer D.O.  Shoulder:   Supraspinatus:  Appears normal on long and transverse views, significant decrease in hypoechoic changes from previous exam Infraspinatus:  Appears normal on long and transverse views. Appears normal Subscapularis:  Appears normal on long and transverse views. Mild bursal bulge. This seems calcific this though shows some reabsorption Teres Minor:  Appears normal on long and transverse views. AC joint:  Capsule undistended, no geyser sign. Glenohumeral Joint:  Appears normal without effusion. Glenoid Labrum:  Intact without visualized tears. Biceps Tendon:  Area where care was seems to be mostly healed with calcific deposit. This seems to be transitioning into good scar tissue.. There is increasing Doppler flow.   Impression: Healed Bicep tendon tear with continued mild subacromial bursitis  Procedure: Real-time Ultrasound Guided Injection of right glenohumeral joint Device: GE Logiq E  Ultrasound guided injection is preferred based studies that show increased duration, increased effect, greater accuracy, decreased  procedural pain, increased response rate with ultrasound guided versus blind injection.  Verbal informed consent obtained.  Time-out conducted.  Noted no overlying erythema, induration, or other signs of local infection.  Skin prepped in a sterile fashion.  Local anesthesia: Topical Ethyl chloride.  With sterile technique and under real time ultrasound guidance:  Joint visualized.  23g 1  inch needle inserted posterior approach. Pictures taken for needle placement. Patient did have injection of 2 cc of 1% lidocaine, 2 cc of 0.5% Marcaine, and 1.0 cc of Kenalog 40 mg/dL. Completed without difficulty  Pain immediately resolved suggesting accurate placement of the medication.  Advised to call if fevers/chills, erythema, induration, drainage, or persistent bleeding.  Images permanently stored and available for review in the ultrasound unit.  Impression: Technically successful ultrasound guided injection.     Impression and Recommendations:     This case required medical decision making of moderate complexity.

## 2013-12-28 ENCOUNTER — Ambulatory Visit: Payer: 59 | Admitting: Family Medicine

## 2014-01-09 ENCOUNTER — Other Ambulatory Visit: Payer: Self-pay | Admitting: Family Medicine

## 2014-01-12 ENCOUNTER — Other Ambulatory Visit: Payer: Self-pay | Admitting: Family Medicine

## 2014-03-05 ENCOUNTER — Other Ambulatory Visit: Payer: Self-pay | Admitting: Family Medicine

## 2014-11-19 ENCOUNTER — Other Ambulatory Visit (INDEPENDENT_AMBULATORY_CARE_PROVIDER_SITE_OTHER): Payer: 59

## 2014-11-19 DIAGNOSIS — Z0000 Encounter for general adult medical examination without abnormal findings: Secondary | ICD-10-CM

## 2014-11-19 LAB — CBC WITH DIFFERENTIAL/PLATELET
BASOS PCT: 0.4 % (ref 0.0–3.0)
Basophils Absolute: 0 10*3/uL (ref 0.0–0.1)
EOS PCT: 3 % (ref 0.0–5.0)
Eosinophils Absolute: 0.1 10*3/uL (ref 0.0–0.7)
HCT: 41.3 % (ref 39.0–52.0)
Hemoglobin: 13.8 g/dL (ref 13.0–17.0)
LYMPHS ABS: 0.8 10*3/uL (ref 0.7–4.0)
Lymphocytes Relative: 20.8 % (ref 12.0–46.0)
MCHC: 33.3 g/dL (ref 30.0–36.0)
MCV: 93.6 fl (ref 78.0–100.0)
Monocytes Absolute: 0.5 10*3/uL (ref 0.1–1.0)
Monocytes Relative: 13.2 % — ABNORMAL HIGH (ref 3.0–12.0)
Neutro Abs: 2.4 10*3/uL (ref 1.4–7.7)
Neutrophils Relative %: 62.6 % (ref 43.0–77.0)
Platelets: 206 10*3/uL (ref 150.0–400.0)
RBC: 4.42 Mil/uL (ref 4.22–5.81)
RDW: 13.9 % (ref 11.5–15.5)
WBC: 3.8 10*3/uL — AB (ref 4.0–10.5)

## 2014-11-19 LAB — HEPATIC FUNCTION PANEL
ALT: 14 U/L (ref 0–53)
AST: 23 U/L (ref 0–37)
Albumin: 4 g/dL (ref 3.5–5.2)
Alkaline Phosphatase: 49 U/L (ref 39–117)
Bilirubin, Direct: 0.2 mg/dL (ref 0.0–0.3)
TOTAL PROTEIN: 6.2 g/dL (ref 6.0–8.3)
Total Bilirubin: 0.9 mg/dL (ref 0.2–1.2)

## 2014-11-19 LAB — POCT URINALYSIS DIPSTICK
Bilirubin, UA: NEGATIVE
Glucose, UA: NEGATIVE
Ketones, UA: NEGATIVE
Leukocytes, UA: NEGATIVE
NITRITE UA: NEGATIVE
PH UA: 7
Protein, UA: NEGATIVE
RBC UA: NEGATIVE
Spec Grav, UA: 1.01
UROBILINOGEN UA: 0.2

## 2014-11-19 LAB — LIPID PANEL
CHOL/HDL RATIO: 3
Cholesterol: 154 mg/dL (ref 0–200)
HDL: 46.5 mg/dL (ref >39.00)
LDL CALC: 95 mg/dL (ref 0–99)
NONHDL: 107.5
Triglycerides: 65 mg/dL (ref 0.0–149.0)
VLDL: 13 mg/dL (ref 0.0–40.0)

## 2014-11-19 LAB — BASIC METABOLIC PANEL
BUN: 12 mg/dL (ref 6–23)
CHLORIDE: 100 meq/L (ref 96–112)
CO2: 27 mEq/L (ref 19–32)
Calcium: 8.9 mg/dL (ref 8.4–10.5)
Creatinine, Ser: 1.1 mg/dL (ref 0.4–1.5)
GFR: 76.88 mL/min (ref >60.00)
Glucose, Bld: 87 mg/dL (ref 70–99)
Potassium: 4.6 mEq/L (ref 3.5–5.1)
Sodium: 135 mEq/L (ref 135–145)

## 2014-11-19 LAB — PSA: PSA: 0.66 ng/mL (ref 0.10–4.00)

## 2014-11-19 LAB — TSH: TSH: 1.71 u[IU]/mL (ref 0.35–4.50)

## 2014-11-29 ENCOUNTER — Encounter: Payer: Self-pay | Admitting: Family Medicine

## 2014-11-29 ENCOUNTER — Ambulatory Visit (INDEPENDENT_AMBULATORY_CARE_PROVIDER_SITE_OTHER): Payer: 59 | Admitting: Family Medicine

## 2014-11-29 VITALS — BP 120/70 | HR 63 | Temp 97.3°F | Ht 72.0 in | Wt 210.0 lb

## 2014-11-29 DIAGNOSIS — Z0000 Encounter for general adult medical examination without abnormal findings: Secondary | ICD-10-CM

## 2014-11-29 DIAGNOSIS — Z23 Encounter for immunization: Secondary | ICD-10-CM

## 2014-11-29 MED ORDER — LEVOTHYROXINE SODIUM 50 MCG PO TABS: 50.0000 ug | ORAL_TABLET | Freq: Every day | ORAL | Status: DC

## 2014-11-29 MED ORDER — VALSARTAN-HYDROCHLOROTHIAZIDE 160-12.5 MG PO TABS: 1.0000 | ORAL_TABLET | Freq: Every day | ORAL | Status: DC

## 2014-11-29 MED ORDER — AZATHIOPRINE 50 MG PO TABS: 50.0000 mg | ORAL_TABLET | Freq: Two times a day (BID) | ORAL | Status: DC

## 2014-11-29 NOTE — Progress Notes (Signed)
Pre visit review using our clinic review tool, if applicable. No additional management support is needed unless otherwise documented below in the visit note. 

## 2014-11-29 NOTE — Progress Notes (Signed)
   Subjective:    Patient ID: Jesus Mccann, male    DOB: 18-Oct-1961, 53 y.o.   MRN: [id number]  HPI   Patient here for complete physical. He has history of hypothyroidism, hypertension, Crohn's disease. He is scheduled to see GI in January. GI symptoms have been stable. He had colonoscopy couple of years ago. He remains on thyroid replacement and takes valsartan and HCTZ for hypertension. No recent dizziness. No chest pains. Still smokes but only rarely. Tetanus up-to-date. Patient declines flu vaccine. He's never had Pneumovax and does take immunosuppressive drugs for his Crohn's disease.  Past Medical History  Diagnosis Date  . CROHN'S DISEASE 01/30/2010  . HYPERTENSION 01/30/2010  . HYPOTHYROIDISM 01/30/2010   Past Surgical History  Procedure Laterality Date  . Bowel resection  20 yrs ago    resection x2=hx crohns.  . Laparoscopic lysis intestinal adhesions      reports that he has been smoking Cigarettes.  He has a 3 pack-year smoking history. He has never used smokeless tobacco. He reports that he drinks alcohol. He reports that he does not use illicit drugs. family history includes COPD in his father; Cancer in his mother; Diabetes in his father. There is no history of Colon cancer. No Known Allergies    Review of Systems  Constitutional: Negative for fever, activity change, appetite change and fatigue.  HENT: Negative for congestion, ear pain and trouble swallowing.   Eyes: Negative for pain and visual disturbance.  Respiratory: Negative for cough, shortness of breath and wheezing.   Cardiovascular: Negative for chest pain and palpitations.  Gastrointestinal: Negative for nausea, vomiting, abdominal pain, diarrhea, constipation, blood in stool, abdominal distention and rectal pain.  Genitourinary: Negative for dysuria, hematuria and testicular pain.  Musculoskeletal: Negative for joint swelling and arthralgias.  Skin: Negative for rash.  Neurological: Negative for dizziness,  syncope and headaches.  Hematological: Negative for adenopathy.  Psychiatric/Behavioral: Negative for confusion and dysphoric mood.       Objective:   Physical Exam  Constitutional: He is oriented to person, place, and time. He appears well-developed and well-nourished. No distress.  HENT:  Head: Normocephalic and atraumatic.  Right Ear: External ear normal.  Left Ear: External ear normal.  Mouth/Throat: Oropharynx is clear and moist.  Eyes: Conjunctivae and EOM are normal. Pupils are equal, round, and reactive to light.  Neck: Normal range of motion. Neck supple. No thyromegaly present.  Cardiovascular: Normal rate, regular rhythm and normal heart sounds.   No murmur heard. Pulmonary/Chest: No respiratory distress. He has no wheezes. He has no rales.  Abdominal: Soft. Bowel sounds are normal. He exhibits no distension and no mass. There is no tenderness. There is no rebound and no guarding.  Musculoskeletal: He exhibits no edema.  Lymphadenopathy:    He has no cervical adenopathy.  Neurological: He is alert and oriented to person, place, and time. He displays normal reflexes. No cranial nerve deficit.  Skin: No rash noted.  Psychiatric: He has a normal mood and affect.          Assessment & Plan:  Complete physical. Labs reviewed. No major concerns. We recommended flu vaccine and he declines. Recommend Pneumovax with risk factor of smoking and immunosuppressant medications.

## 2014-11-30 ENCOUNTER — Telehealth: Payer: Self-pay | Admitting: Family Medicine

## 2014-11-30 NOTE — Telephone Encounter (Signed)
emmi mailed  °

## 2014-12-13 ENCOUNTER — Encounter: Payer: Self-pay | Admitting: Internal Medicine

## 2014-12-13 ENCOUNTER — Ambulatory Visit (INDEPENDENT_AMBULATORY_CARE_PROVIDER_SITE_OTHER): Payer: 59 | Admitting: Internal Medicine

## 2014-12-13 ENCOUNTER — Other Ambulatory Visit (INDEPENDENT_AMBULATORY_CARE_PROVIDER_SITE_OTHER): Payer: 59

## 2014-12-13 VITALS — BP 110/90 | HR 60 | Ht 72.0 in | Wt 215.0 lb

## 2014-12-13 DIAGNOSIS — Z79899 Other long term (current) drug therapy: Secondary | ICD-10-CM

## 2014-12-13 DIAGNOSIS — Z8719 Personal history of other diseases of the digestive system: Secondary | ICD-10-CM

## 2014-12-13 DIAGNOSIS — Z1211 Encounter for screening for malignant neoplasm of colon: Secondary | ICD-10-CM

## 2014-12-13 LAB — VITAMIN D 25 HYDROXY (VIT D DEFICIENCY, FRACTURES): VITD: 20.08 ng/mL — ABNORMAL LOW (ref 30.00–100.00)

## 2014-12-13 LAB — VITAMIN B12: Vitamin B-12: 10 pg/mL — ABNORMAL LOW (ref 211–911)

## 2014-12-13 NOTE — Patient Instructions (Addendum)
Your Bone Density Scan is scheduled at [facility] on  12/18/2014 at 1pm Go to the basement for labs today Follow up in 1 year Discontinue Asacol Continue Azathioprine

## 2014-12-13 NOTE — Progress Notes (Signed)
Patient ID: Jesus Mccann, male   DOB: 1961-04-01, 53 y.o.   MRN: [id number] HPI: Maliq Pilley is a 53 year old male with a past medical history of ileal Crohn's disease diagnosed approximately 30 years ago status post 3 small bowel resections with ileocolonic anastomosis, hypertension and hypothyroidism who is seen in consultation at the request of Dr. Elease Hashimoto to establish care for Crohn's disease. He is here today with his wife. He reports he is feeling well and denies GI complaint. Bowel movements have been normal for him which varies from no stools a day to 1-2 stools daily. He denies blood in his stool or melena. No abdominal pain. Good appetite. No nausea or vomiting. No dysphagia or odynophagia. Weight is stable. No rash, oral ulcer, ocular complaints, or new joint pains. He does take azathioprine 150 mg daily which she is done for many many years. He is also taking mesalamine 800 mg once daily. He recently had Pneumovax but is opposed to flu vaccine.  He does state that prior to his surgical resections he had fistulized and stricturing Crohn's disease of the small bowel. Initially they were worried about possible short bowel disease. His last of 3 surgeries was in 1992. Last colonoscopy was performed by Dr. Verl Blalock on 08/01/2012 which revealed a right hemicolectomy with widely patent ileocolonic anastomosis. There were a few anastomotic erosions noted but no polyps or cancer seen. He did not feel like this represented active Crohn's disease. He recommended repeat colonoscopy in 5 years   Past Medical History  Diagnosis Date  . CROHN'S DISEASE 01/30/2010  . HYPERTENSION 01/30/2010  . HYPOTHYROIDISM 01/30/2010    Past Surgical History  Procedure Laterality Date  . Bowel resection  20 yrs ago    resection x2=hx crohns.  . Laparoscopic lysis intestinal adhesions      Outpatient Prescriptions Prior to Visit  Medication Sig Dispense Refill  . ASACOL HD 800 MG TBEC TAKE 1 TABLET TWICE A  DAY 180 tablet 2  . azaTHIOprine (IMURAN) 50 MG tablet Take 1 tablet (50 mg total) by mouth 2 (two) times daily. 180 tablet 3  . levothyroxine (SYNTHROID, LEVOTHROID) 50 MCG tablet Take 1 tablet (50 mcg total) by mouth daily. 90 tablet 3  . valsartan-hydrochlorothiazide (DIOVAN-HCT) 160-12.5 MG per tablet Take 1 tablet by mouth daily. 90 tablet 3   No facility-administered medications prior to visit.    No Known Allergies  Family History  Problem Relation Age of Onset  . Cancer Mother     breast cancer  . COPD Father   . Diabetes Father   . Colon cancer Neg Hx     History  Substance Use Topics  . Smoking status: Former Smoker -- 0.50 packs/day for 6 years    Types: Cigarettes  . Smokeless tobacco: Never Used  . Alcohol Use: 0.0 oz/week    0 Not specified per week     Comment: occasional beer per pt.    ROS: As per history of present illness, otherwise negative  BP 110/90 mmHg  Pulse 60  Ht 6' (1.829 m)  Wt 215 lb (97.523 kg)  BMI 29.15 kg/m2 Constitutional: Well-developed and well-nourished. No distress. HEENT: Normocephalic and atraumatic. Oropharynx is clear and moist. No oropharyngeal exudate. Conjunctivae are normal.  No scleral icterus. Neck: Neck supple. Trachea midline. Cardiovascular: Normal rate, regular rhythm and intact distal pulses. No M/R/G Pulmonary/chest: Effort normal and breath sounds normal. No wheezing, rales or rhonchi. Abdominal: Soft, nontender, nondistended. Bowel sounds active throughout. There are no  masses palpable. No hepatosplenomegaly. well-healed midline abdominal scar  Extremities: no clubbing, cyanosis, or edema Lymphadenopathy: No cervical adenopathy noted. Neurological: Alert and oriented to person place and time. Skin: Skin is warm and dry. No rashes noted. Psychiatric: Normal mood and affect. Behavior is normal.  RELEVANT LABS AND IMAGING: CBC    Component Value Date/Time   WBC 3.8* 11/19/2014 0808   RBC 4.42 11/19/2014 0808    HGB 13.8 11/19/2014 0808   HCT 41.3 11/19/2014 0808   PLT 206.0 11/19/2014 0808   MCV 93.6 11/19/2014 0808   MCHC 33.3 11/19/2014 0808   RDW 13.9 11/19/2014 0808   LYMPHSABS 0.8 11/19/2014 0808   MONOABS 0.5 11/19/2014 0808   EOSABS 0.1 11/19/2014 0808   BASOSABS 0.0 11/19/2014 0808    CMP     Component Value Date/Time   NA 135 11/19/2014 0808   K 4.6 11/19/2014 0808   CL 100 11/19/2014 0808   CO2 27 11/19/2014 0808   GLUCOSE 87 11/19/2014 0808   BUN 12 11/19/2014 0808   CREATININE 1.1 11/19/2014 0808   CALCIUM 8.9 11/19/2014 0808   PROT 6.2 11/19/2014 0808   ALBUMIN 4.0 11/19/2014 0808   AST 23 11/19/2014 0808   ALT 14 11/19/2014 0808   ALKPHOS 49 11/19/2014 0808   BILITOT 0.9 11/19/2014 0808   GFRNONAA 83.73 02/24/2010 0845    colonoscopy records reviewed and discussed with the patient   ASSESSMENT/PLAN:  53 year old male with a past medical history of ileal Crohn's disease diagnosed approximately 30 years ago status post 3 small bowel resections with ileocolonic anastomosis, hypertension and hypothyroidism who is seen in consultation at the request of Dr. Elease Hashimoto to establish care for Crohn's disease.  1. Ileal Crohn's disease, in remission -- his Crohn's disease was aggressive in that it was fistula arising and stricturing but has been in remission for 15-20 years. We discussed that he likely had surgical or but that Crohn's disease can never the cured and he will be at risk for recurrence. For this reason I have recommended that we continue azathioprine 100 mg daily. White count was very slightly low at 3.8 when recently checked. I'm sending a thiopurine metabolite panel to determine the current dose of this medication and to help with dosing. I do not think mesalamine is helping him in any way. He has not had colonic Crohn's, 5-ASA's are not extremely effective in Crohn's colitis anyway, and his mesalamine product treats colonic disease. For all these reasons I'm  discontinuing this medication today. 12 and vitamin D. I have recommended a bone density scan given his use of steroids for many years around the time that his disease was active. He has had Pneumovax and declines flu vaccine. --He will need labs at least every 6 months on azathioprine, CBC and CMP. --Recently liver enzymes normal --Follow-up in one year, sooner if necessary  2. CRC screening, reported hx of colon polyps  -- no history of colonic Crohn's. Repeat colonoscopy recommended in August 2018

## 2014-12-17 ENCOUNTER — Other Ambulatory Visit: Payer: Self-pay | Admitting: Internal Medicine

## 2014-12-17 MED ORDER — CYANOCOBALAMIN 1000 MCG/ML IJ SOLN: 1000.0000 ug | Freq: Once | INTRAMUSCULAR | Status: DC

## 2014-12-17 MED ORDER — NEEDLES & SYRINGES MISC: Status: DC

## 2014-12-17 MED ORDER — ERGOCALCIFEROL 1.25 MG (50000 UT) PO CAPS: 50000.0000 [IU] | ORAL_CAPSULE | ORAL | Status: DC

## 2014-12-17 NOTE — Telephone Encounter (Signed)
Pt aware and rx has been sent

## 2014-12-17 NOTE — Telephone Encounter (Signed)
Vitamin B12 is incredibly low, needs IM B12 1000 mcg every week 6 weeks, then monthly indefinitely Vitamin D also quite low, replace with vitamin D 50,000 IU once every 7 days 8 weeks then 1000 international units daily indefinitely  Left message on machine to call back

## 2014-12-18 ENCOUNTER — Ambulatory Visit (HOSPITAL_COMMUNITY)
Admission: RE | Admit: 2014-12-18 | Discharge: 2014-12-18 | Disposition: A | Discharge status: Home / Self Care | Payer: 59 | Source: Ambulatory Visit | Attending: Internal Medicine | Admitting: Internal Medicine

## 2014-12-18 DIAGNOSIS — Z7952 Long term (current) use of systemic steroids: Secondary | ICD-10-CM | POA: Diagnosis not present

## 2014-12-18 DIAGNOSIS — Z8719 Personal history of other diseases of the digestive system: Secondary | ICD-10-CM

## 2014-12-18 DIAGNOSIS — Z13820 Encounter for screening for osteoporosis: Secondary | ICD-10-CM | POA: Insufficient documentation

## 2014-12-18 DIAGNOSIS — K5090 Crohn's disease, unspecified, without complications: Secondary | ICD-10-CM | POA: Insufficient documentation

## 2014-12-19 LAB — THIOPURINE METABOLITES
6 TGN METABOLITE: 221 pmol/8x 10E8
6-MMPN Metaboilte: 228 pmol/8x 10E8

## 2014-12-28 ENCOUNTER — Encounter: Payer: Self-pay | Admitting: Internal Medicine

## 2015-01-20 ENCOUNTER — Emergency Department (HOSPITAL_COMMUNITY)
Admission: EM | Admit: 2015-01-20 | Discharge: 2015-01-20 | Disposition: A | Discharge status: Home / Self Care | Payer: 59 | Attending: Emergency Medicine | Admitting: Emergency Medicine

## 2015-01-20 ENCOUNTER — Encounter (HOSPITAL_COMMUNITY): Payer: Self-pay | Admitting: Emergency Medicine

## 2015-01-20 DIAGNOSIS — Z79899 Other long term (current) drug therapy: Secondary | ICD-10-CM | POA: Insufficient documentation

## 2015-01-20 DIAGNOSIS — E039 Hypothyroidism, unspecified: Secondary | ICD-10-CM | POA: Insufficient documentation

## 2015-01-20 DIAGNOSIS — R55 Syncope and collapse: Secondary | ICD-10-CM | POA: Diagnosis present

## 2015-01-20 DIAGNOSIS — Z87891 Personal history of nicotine dependence: Secondary | ICD-10-CM | POA: Diagnosis not present

## 2015-01-20 DIAGNOSIS — Z8719 Personal history of other diseases of the digestive system: Secondary | ICD-10-CM | POA: Insufficient documentation

## 2015-01-20 DIAGNOSIS — I10 Essential (primary) hypertension: Secondary | ICD-10-CM | POA: Diagnosis not present

## 2015-01-20 LAB — BASIC METABOLIC PANEL
ANION GAP: 8 (ref 5–15)
BUN: 14 mg/dL (ref 6–23)
CO2: 28 mmol/L (ref 19–32)
Calcium: 9.1 mg/dL (ref 8.4–10.5)
Chloride: 105 mmol/L (ref 96–112)
Creatinine, Ser: 1.04 mg/dL (ref 0.50–1.35)
GFR calc non Af Amer: 79 mL/min — ABNORMAL LOW (ref >90)
Glucose, Bld: 113 mg/dL — ABNORMAL HIGH (ref 70–99)
Potassium: 3.2 mmol/L — ABNORMAL LOW (ref 3.5–5.1)
Sodium: 141 mmol/L (ref 135–145)

## 2015-01-20 LAB — CBC
HCT: 39.2 % (ref 39.0–52.0)
Hemoglobin: 13.6 g/dL (ref 13.0–17.0)
MCH: 31.6 pg (ref 26.0–34.0)
MCHC: 34.7 g/dL (ref 30.0–36.0)
MCV: 91 fL (ref 78.0–100.0)
Platelets: 238 10*3/uL (ref 150–400)
RBC: 4.31 MIL/uL (ref 4.22–5.81)
RDW: 12.7 % (ref 11.5–15.5)
WBC: 5.3 10*3/uL (ref 4.0–10.5)

## 2015-01-20 LAB — CBG MONITORING, ED: GLUCOSE-CAPILLARY: 118 mg/dL — AB (ref 70–99)

## 2015-01-20 NOTE — ED Provider Notes (Signed)
CSN: [id number]     Arrival date & time 01/20/15  66 History   First MD Initiated Contact with Patient 01/20/15 1952     Chief Complaint  Patient presents with  . Loss of Consciousness   HPI Pt was finishing dinner and went to get up.  He was walking in the kitchen.  He did feel lightheaded.  The next thing he remembers is his wife next to him on the floor.  This lasted for about one minute.  No shaking during the event.  For about 15 minutes  He was asking some of the same questions over and over.  The patient does remember that occurring.  All symptoms have resolved at this point.  No chest pain or shortness.  No N,v.  Some loose stools but no more than usual.  No prior syncope or seizures. Past Medical History  Diagnosis Date  . CROHN'S DISEASE 01/30/2010  . HYPERTENSION 01/30/2010  . HYPOTHYROIDISM 01/30/2010   Past Surgical History  Procedure Laterality Date  . Bowel resection  20 yrs ago    resection x2=hx crohns.  . Laparoscopic lysis intestinal adhesions     Family History  Problem Relation Age of Onset  . Cancer Mother     breast cancer  . COPD Father   . Diabetes Father   . Colon cancer Neg Hx    History  Substance Use Topics  . Smoking status: Former Smoker -- 0.50 packs/day for 6 years    Types: Cigarettes  . Smokeless tobacco: Never Used  . Alcohol Use: 0.0 oz/week    0 Not specified per week     Comment: occasional beer per pt.    Review of Systems  All other systems reviewed and are negative.     Allergies  Review of patient's allergies indicates no known allergies.  Home Medications   Prior to Admission medications   Medication Sig Start Date End Date Taking? Authorizing Provider  azaTHIOprine (IMURAN) 50 MG tablet Take 1 tablet (50 mg total) by mouth 2 (two) times daily. 11/29/14  Yes Eulas Post, MD  cyanocobalamin (,VITAMIN B-12,) 1000 MCG/ML injection Inject 1 mL (1,000 mcg total) into the muscle once. Weekly for 6 weeks then monthly  12/17/14  Yes Jerene Bears, MD  ergocalciferol (VITAMIN D2) 50000 UNITS capsule Take 1 capsule (50,000 Units total) by mouth once a week. 12/17/14  Yes Jerene Bears, MD  levothyroxine (SYNTHROID, LEVOTHROID) 50 MCG tablet Take 1 tablet (50 mcg total) by mouth daily. 11/29/14  Yes Eulas Post, MD  valsartan-hydrochlorothiazide (DIOVAN-HCT) 160-12.5 MG per tablet Take 1 tablet by mouth daily. 11/29/14  Yes Eulas Post, MD  ASACOL HD 800 MG TBEC TAKE 1 TABLET TWICE A DAY Patient not taking: Reported on 01/20/2015 03/05/14   Eulas Post, MD  Needles & Syringes MISC Please provide syringes and needles for B12 injections Patient not taking: Reported on 01/20/2015 12/17/14   Jerene Bears, MD   BP 126/87 mmHg  Pulse 65  Temp(Src) 97.8 F (36.6 C) (Oral)  Resp 12  SpO2 97% Physical Exam  Constitutional: He appears well-developed and well-nourished. No distress.  HENT:  Head: Normocephalic and atraumatic.  Right Ear: External ear normal.  Left Ear: External ear normal.  Eyes: Conjunctivae are normal. Right eye exhibits no discharge. Left eye exhibits no discharge. No scleral icterus.  Neck: Neck supple. No tracheal deviation present.  Cardiovascular: Normal rate, regular rhythm and intact distal pulses.   Pulmonary/Chest:  Effort normal and breath sounds normal. No stridor. No respiratory distress. He has no wheezes. He has no rales.  Abdominal: Soft. Bowel sounds are normal. He exhibits no distension. There is no tenderness. There is no rebound and no guarding.  Musculoskeletal: He exhibits no edema or tenderness.  Neurological: He is alert. He has normal strength. No cranial nerve deficit (no facial droop, extraocular movements intact, no slurred speech) or sensory deficit. He exhibits normal muscle tone. He displays no seizure activity. Coordination normal.  Skin: Skin is warm and dry. No rash noted.  Psychiatric: He has a normal mood and affect.  Nursing note and vitals  reviewed.   ED Course  Procedures (including critical care time) Labs Review Labs Reviewed  BASIC METABOLIC PANEL - Abnormal; Notable for the following:    Potassium 3.2 (*)    Glucose, Bld 113 (*)    GFR calc non Af Amer 79 (*)    All other components within normal limits  CBG MONITORING, ED - Abnormal; Notable for the following:    Glucose-Capillary 118 (*)    All other components within normal limits  CBC    Imaging Review No results found.   EKG Interpretation   Date/Time:  Sunday January 20 2015 19:04:47 EST Ventricular Rate:  55 PR Interval:  162 QRS Duration: 109 QT Interval:  415 QTC Calculation: 397 R Axis:   34 Text Interpretation:  Sinus rhythm Baseline wander in lead(s) V3 V5 No old  tracing to compare Confirmed by Marik Sedore  MD-J, Alexa Golebiewski ([id number]) on 01/20/2015  8:28:35 PM      MDM   Final diagnoses:  Syncope, unspecified syncope type    Pt monitored in the ED for a few hours.  No symptoms.  He has been able to walk around without difficulty.  Low risk for major cardiac events.  No history of CHF or ACS.  Pt is comfortable with outpatient follow up, possible holter monitor, echo.    Dorie Rank, MD 01/20/15 2220

## 2015-01-20 NOTE — Discharge Instructions (Signed)
Syncope Syncope means a person passes out (faints). The person usually wakes up in less than 5 minutes. It is important to seek medical care for syncope. HOME CARE  Have someone stay with you until you feel normal.  Do not drive, use machines, or play sports until your doctor says it is okay.  Keep all doctor visits as told.  Lie down when you feel like you might pass out. Take deep breaths. Wait until you feel normal before standing up.  Drink enough fluids to keep your pee (urine) clear or pale yellow.  If you take blood pressure or heart medicine, get up slowly. Take several minutes to sit and then stand. GET HELP RIGHT AWAY IF:   You have a severe headache.  You have pain in the chest, belly (abdomen), or back.  You are bleeding from the mouth or butt (rectum).  You have black or tarry poop (stool).  You have an irregular or very fast heartbeat.  You have pain with breathing.  You keep passing out, or you have shaking (seizures) when you pass out.  You pass out when sitting or lying down.  You feel confused.  You have trouble walking.  You have severe weakness.  You have vision problems. If you fainted, call your local emergency services (911 in [place] Do not drive yourself to the hospital. MAKE SURE YOU:   Understand these instructions.  Will watch your condition.  Will get help right away if you are not doing well or get worse. Document Released: 05/25/2008 Document Revised: 06/07/2012 Document Reviewed: 02/05/2012 [facility] Patient Information 2015 [place], [place]. This information is not intended to replace advice given to you by your health care provider. Make sure you discuss any questions you have with your health care provider.

## 2015-01-20 NOTE — ED Notes (Signed)
Pt ambulated to restroom and back to stretcher with no assistance. Steady gait.

## 2015-01-20 NOTE — ED Notes (Signed)
Per wife/patient-states he was in kitchen and felt dizzy-wife states he ":passed out" and was unconscious for a least a minute,-wife grabbed him and lowered home to the ground-did not hit head-feels fine now

## 2015-01-24 ENCOUNTER — Encounter: Payer: Self-pay | Admitting: Family Medicine

## 2015-01-24 ENCOUNTER — Ambulatory Visit (INDEPENDENT_AMBULATORY_CARE_PROVIDER_SITE_OTHER): Payer: 59 | Admitting: Family Medicine

## 2015-01-24 VITALS — BP 120/80 | HR 72 | Temp 97.4°F | Wt 208.0 lb

## 2015-01-24 DIAGNOSIS — E876 Hypokalemia: Secondary | ICD-10-CM

## 2015-01-24 DIAGNOSIS — R55 Syncope and collapse: Secondary | ICD-10-CM

## 2015-01-24 MED ORDER — VALSARTAN 160 MG PO TABS: 160.0000 mg | ORAL_TABLET | Freq: Every day | ORAL | Status: DC

## 2015-01-24 NOTE — Progress Notes (Signed)
Pre visit review using our clinic review tool, if applicable. No additional management support is needed unless otherwise documented below in the visit note. 

## 2015-01-24 NOTE — Progress Notes (Signed)
   Subjective:    Patient ID: Jesus Mccann, male    DOB: 1961/05/24, 54 y.o.   MRN: [id number]  HPI   Patient here following recent syncopal episode. This occurred at home on Sunday evening and was observed by his wife. They just finished dinner and he generally had felt well that day. He was walking in the kitchen felt lightheaded and had sudden syncope. Wife states he was out for about 1 minute. No tonic-clonic activity. No urine or stool incontinence. Wife states that he seemed somewhat "foggy" for about 15 minutes but then seemed normal. He did not have any speech changes. No headache. No head injury. No focal weakness. No visual changes. His felt completely well since then. He does take valsartan HCTZ for hypertension. He's not had any orthostatic symptoms whatsoever. Labs were unremarkable except for slightly low potassium 3.2. EKG unremarkable. He was discharged home and told to follow-up.  Denies any recent palpitations.  Past Medical History  Diagnosis Date  . CROHN'S DISEASE 01/30/2010  . HYPERTENSION 01/30/2010  . HYPOTHYROIDISM 01/30/2010   Past Surgical History  Procedure Laterality Date  . Bowel resection  20 yrs ago    resection x2=hx crohns.  . Laparoscopic lysis intestinal adhesions      reports that he has quit smoking. His smoking use included Cigarettes. He has a 3 pack-year smoking history. He has never used smokeless tobacco. He reports that he drinks alcohol. He reports that he does not use illicit drugs. family history includes COPD in his father; Cancer in his mother; Diabetes in his father. There is no history of Colon cancer. No Known Allergies    Review of Systems  Constitutional: Negative for appetite change, fatigue and unexpected weight change.  Eyes: Negative for visual disturbance.  Respiratory: Negative for cough, chest tightness and shortness of breath.   Cardiovascular: Negative for chest pain, palpitations and leg swelling.  Gastrointestinal:  Negative for nausea, vomiting, abdominal pain and diarrhea.  Genitourinary: Negative for dysuria.  Neurological: Negative for dizziness, tremors, seizures, weakness, light-headedness and headaches.  Psychiatric/Behavioral: Negative for confusion.       Objective:   Physical Exam  Constitutional: He is oriented to person, place, and time. He appears well-developed and well-nourished.  HENT:  Right Ear: External ear normal.  Left Ear: External ear normal.  Mouth/Throat: Oropharynx is clear and moist.  Eyes: Pupils are equal, round, and reactive to light.  Neck: Neck supple. No thyromegaly present.  Cardiovascular: Normal rate and regular rhythm.   Pulmonary/Chest: Effort normal and breath sounds normal. No respiratory distress. He has no wheezes. He has no rales.  Musculoskeletal: He exhibits no edema.  Neurological: He is alert and oriented to person, place, and time. He has normal reflexes. No cranial nerve deficit. He exhibits normal muscle tone. Coordination normal.  Psychiatric: He has a normal mood and affect. His behavior is normal.          Assessment & Plan:  Recent syncopal episode. This does not sound classic for vasovagal. He has not had any recent hypotension issues. Has blood pressure today standing of 115/78. Differential would include rule out seizure. Doubt cardiac etiology. Discontinue valsartan HCTZ and start plain valsartan 160 mg once daily. Set up EEG, echocardiogram, event monitor. Follow-up immediately for any recurrent episodes. We've advised him to avoid high-risk situations such as climbing on ladders, etc. Mild hypokalemia by recent labs and d/c HCTZ as above and increase dietary replacement.

## 2015-01-24 NOTE — Patient Instructions (Signed)
Syncope Syncope is a medical term for fainting or passing out. This means you lose consciousness and drop to the ground. People are generally unconscious for less than 5 minutes. You may have some muscle twitches for up to 15 seconds before waking up and returning to normal. Syncope occurs more often in older adults, but it can happen to anyone. While most causes of syncope are not dangerous, syncope can be a sign of a serious medical problem. It is important to seek medical care.  CAUSES  Syncope is caused by a sudden drop in blood flow to the brain. The specific cause is often not determined. Factors that can bring on syncope include:  Taking medicines that lower blood pressure.  Sudden changes in posture, such as standing up quickly.  Taking more medicine than prescribed.  Standing in one place for too long.  Seizure disorders.  Dehydration and excessive exposure to heat.  Low blood sugar (hypoglycemia).  Straining to have a bowel movement.  Heart disease, irregular heartbeat, or other circulatory problems.  Fear, emotional distress, seeing blood, or severe pain. SYMPTOMS  Right before fainting, you may:  Feel dizzy or light-headed.  Feel nauseous.  See all white or all black in your field of vision.  Have cold, clammy skin. DIAGNOSIS  Your health care provider will ask about your symptoms, perform a physical exam, and perform an electrocardiogram (ECG) to record the electrical activity of your heart. Your health care provider may also perform other heart or blood tests to determine the cause of your syncope which may include:  Transthoracic echocardiogram (TTE). During echocardiography, sound waves are used to evaluate how blood flows through your heart.  Transesophageal echocardiogram (TEE).  Cardiac monitoring. This allows your health care provider to monitor your heart rate and rhythm in real time.  Holter monitor. This is a portable device that records your  heartbeat and can help diagnose heart arrhythmias. It allows your health care provider to track your heart activity for several days, if needed.  Stress tests by exercise or by giving medicine that makes the heart beat faster. TREATMENT  In most cases, no treatment is needed. Depending on the cause of your syncope, your health care provider may recommend changing or stopping some of your medicines. HOME CARE INSTRUCTIONS  Have someone stay with you until you feel stable.  Do not drive, use machinery, or play sports until your health care provider says it is okay.  Keep all follow-up appointments as directed by your health care provider.  Lie down right away if you start feeling like you might faint. Breathe deeply and steadily. Wait until all the symptoms have passed.  Drink enough fluids to keep your urine clear or pale yellow.  If you are taking blood pressure or heart medicine, get up slowly and take several minutes to sit and then stand. This can reduce dizziness. SEEK IMMEDIATE MEDICAL CARE IF:   You have a severe headache.  You have unusual pain in the chest, abdomen, or back.  You are bleeding from your mouth or rectum, or you have black or tarry stool.  You have an irregular or very fast heartbeat.  You have pain with breathing.  You have repeated fainting or seizure-like jerking during an episode.  You faint when sitting or lying down.  You have confusion.  You have trouble walking.  You have severe weakness.  You have vision problems. If you fainted, call your local emergency services (911 in [place] Do not drive  yourself to the hospital.  MAKE SURE YOU:  Understand these instructions.  Will watch your condition.  Will get help right away if you are not doing well or get worse. Document Released: 12/07/2005 Document Revised: 12/12/2013 Document Reviewed: 02/05/2012 [facility] Patient Information 2015 [place], [place]. This information is not intended to replace  advice given to you by your health care provider. Make sure you discuss any questions you have with your health care provider.  We will call you with studies-EEG, ECHO, and event monitor.

## 2015-01-30 ENCOUNTER — Encounter: Payer: Self-pay | Admitting: *Deleted

## 2015-01-30 ENCOUNTER — Ambulatory Visit (HOSPITAL_COMMUNITY): Payer: 59 | Attending: Family Medicine | Admitting: Cardiology

## 2015-01-30 ENCOUNTER — Encounter (INDEPENDENT_AMBULATORY_CARE_PROVIDER_SITE_OTHER): Payer: 59

## 2015-01-30 DIAGNOSIS — R55 Syncope and collapse: Secondary | ICD-10-CM

## 2015-01-30 NOTE — Progress Notes (Signed)
Patient ID: Jesus Mccann, male   DOB: 04/24/1961, 54 y.o.   MRN: [id number] [employer] 30 day cardiac event monitor to be applied to patient.

## 2015-01-30 NOTE — Progress Notes (Signed)
Echo performed. 

## 2015-08-14 ENCOUNTER — Encounter: Payer: Self-pay | Admitting: Family Medicine

## 2015-08-14 ENCOUNTER — Ambulatory Visit (INDEPENDENT_AMBULATORY_CARE_PROVIDER_SITE_OTHER): Payer: 59 | Admitting: Family Medicine

## 2015-08-14 VITALS — BP 120/70 | HR 84 | Temp 98.0°F | Wt 216.0 lb

## 2015-08-14 DIAGNOSIS — S39012A Strain of muscle, fascia and tendon of lower back, initial encounter: Secondary | ICD-10-CM | POA: Diagnosis not present

## 2015-08-14 MED ORDER — CYCLOBENZAPRINE HCL 10 MG PO TABS: 10.0000 mg | ORAL_TABLET | Freq: Three times a day (TID) | ORAL | Status: DC | PRN

## 2015-08-14 NOTE — Progress Notes (Signed)
Pre visit review using our clinic review tool, if applicable. No additional management support is needed unless otherwise documented below in the visit note. 

## 2015-08-14 NOTE — Progress Notes (Signed)
   Subjective:    Patient ID: Jesus Mccann, male    DOB: Apr 28, 1961, 54 y.o.   MRN: [id number]  HPI Acute visit for low back pain. Onset last weekend. He was at work and was bent over [address] minutes pressure washing and unable to straighten up. His pain started the next day. Location is mid lumbar with no radiculopathy. He has a dull ache. Worse with position change such as back flexion. He tried topical cream and Advil without much relief. No weakness or numbness lower extremities.  Past Medical History  Diagnosis Date  . CROHN'S DISEASE 01/30/2010  . HYPERTENSION 01/30/2010  . HYPOTHYROIDISM 01/30/2010   Past Surgical History  Procedure Laterality Date  . Bowel resection  20 yrs ago    resection x2=hx crohns.  . Laparoscopic lysis intestinal adhesions      reports that he has quit smoking. His smoking use included Cigarettes. He has a 3 pack-year smoking history. He has never used smokeless tobacco. He reports that he drinks alcohol. He reports that he does not use illicit drugs. family history includes COPD in his father; Cancer in his mother; Diabetes in his father. There is no history of Colon cancer. No Known Allergies    Review of Systems  Constitutional: Negative for fever, activity change, appetite change and unexpected weight change.  Respiratory: Negative for cough and shortness of breath.   Cardiovascular: Negative for chest pain and leg swelling.  Gastrointestinal: Negative for vomiting and abdominal pain.  Genitourinary: Negative for dysuria, hematuria and flank pain.  Musculoskeletal: Positive for back pain. Negative for joint swelling.  Neurological: Negative for weakness and numbness.       Objective:   Physical Exam  Constitutional: He is oriented to person, place, and time. He appears well-developed and well-nourished. No distress.  Neck: No thyromegaly present.  Cardiovascular: Normal rate, regular rhythm and normal heart sounds.   No murmur  heard. Pulmonary/Chest: Effort normal and breath sounds normal. No respiratory distress. He has no wheezes. He has no rales.  Musculoskeletal: He exhibits no edema.  Straight leg raise is negative bilaterally  Neurological: He is alert and oriented to person, place, and time. He has normal reflexes. No cranial nerve deficit.  Full-strength lower extremities  Skin: No rash noted.          Assessment & Plan:  Mid lower lumbar back strain. Nonfocal exam neurologically. Continue heat or ice for symptom relief. Extension stretches reviewed. Continue Advil or Aleve. Flexeril 10 mg daily at bedtime. Consider physical therapy in 2 weeks if no better

## 2015-08-14 NOTE — Patient Instructions (Signed)
Low Back Sprain with Rehab  A sprain is an injury in which a ligament is torn. The ligaments of the lower back are vulnerable to sprains. However, they are strong and require great force to be injured. These ligaments are important for stabilizing the spinal column. Sprains are classified into three categories. Grade 1 sprains cause pain, but the tendon is not lengthened. Grade 2 sprains include a lengthened ligament, due to the ligament being stretched or partially ruptured. With grade 2 sprains there is still function, although the function may be decreased. Grade 3 sprains involve a complete tear of the tendon or muscle, and function is usually impaired. SYMPTOMS   Severe pain in the lower back.  Sometimes, a feeling of a "pop," "snap," or tear, at the time of injury.  Tenderness and sometimes swelling at the injury site.  Uncommonly, bruising (contusion) within 48 hours of injury.  Muscle spasms in the back. CAUSES  Low back sprains occur when a force is placed on the ligaments that is greater than they can handle. Common causes of injury include:  Performing a stressful act while off-balance.  Repetitive stressful activities that involve movement of the lower back.  Direct hit (trauma) to the lower back. RISK INCREASES WITH:  Contact sports (football, wrestling).  Collisions (major skiing accidents).  Sports that require throwing or lifting (baseball, weightlifting).  Sports involving twisting of the spine (gymnastics, diving, tennis, golf).  Poor strength and flexibility.  Inadequate protection.  Previous back injury or surgery (especially fusion). PREVENTION  Wear properly fitted and padded protective equipment.  Warm up and stretch properly before activity.  Allow for adequate recovery between workouts.  Maintain physical fitness:  Strength, flexibility, and endurance.  Cardiovascular fitness.  Maintain a healthy body weight. PROGNOSIS  If treated  properly, low back sprains usually heal with non-surgical treatment. The length of time for healing depends on the severity of the injury.  RELATED COMPLICATIONS   Recurring symptoms, resulting in a chronic problem.  Chronic inflammation and pain in the low back.  Delayed healing or resolution of symptoms, especially if activity is resumed too soon.  Prolonged impairment.  Unstable or arthritic joints of the low back. TREATMENT  Treatment first involves the use of ice and medicine, to reduce pain and inflammation. The use of strengthening and stretching exercises may help reduce pain with activity. These exercises may be performed at home or with a therapist. Severe injuries may require referral to a therapist for further evaluation and treatment, such as ultrasound. Your caregiver may advise that you wear a back brace or corset, to help reduce pain and discomfort. Often, prolonged bed rest results in greater harm then benefit. Corticosteroid injections may be recommended. However, these should be reserved for the most serious cases. It is important to avoid using your back when lifting objects. At night, sleep on your back on a firm mattress, with a pillow placed under your knees. If non-surgical treatment is unsuccessful, surgery may be needed.  MEDICATION   If pain medicine is needed, nonsteroidal anti-inflammatory medicines (aspirin and ibuprofen), or other minor pain relievers (acetaminophen), are often advised.  Do not take pain medicine for 7 days before surgery.  Prescription pain relievers may be given, if your caregiver thinks they are needed. Use only as directed and only as much as you need.  Ointments applied to the skin may be helpful.  Corticosteroid injections may be given by your caregiver. These injections should be reserved for the most serious cases,   because they may only be given a certain number of times. HEAT AND COLD  Cold treatment (icing) should be applied for 10  to 15 minutes every 2 to 3 hours for inflammation and pain, and immediately after activity that aggravates your symptoms. Use ice packs or an ice massage.  Heat treatment may be used before performing stretching and strengthening activities prescribed by your caregiver, physical therapist, or athletic trainer. Use a heat pack or a warm water soak. SEEK MEDICAL CARE IF:   Symptoms get worse or do not improve in 2 to 4 weeks, despite treatment.  You develop numbness or weakness in either leg.  You lose bowel or bladder function.  Any of the following occur after surgery: fever, increased pain, swelling, redness, drainage of fluids, or bleeding in the affected area.  New, unexplained symptoms develop. (Drugs used in treatment may produce side effects.) EXERCISES  RANGE OF MOTION (ROM) AND STRETCHING EXERCISES - Low Back Sprain Most people with lower back pain will find that their symptoms get worse with excessive bending forward (flexion) or arching at the lower back (extension). The exercises that will help resolve your symptoms will focus on the opposite motion.  Your physician, physical therapist or athletic trainer will help you determine which exercises will be most helpful to resolve your lower back pain. Do not complete any exercises without first consulting with your caregiver. Discontinue any exercises which make your symptoms worse, until you speak to your caregiver. If you have pain, numbness or tingling which travels down into your buttocks, leg or foot, the goal of the therapy is for these symptoms to move closer to your back and eventually resolve. Sometimes, these leg symptoms will get better, but your lower back pain may worsen. This is often an indication of progress in your rehabilitation. Be very alert to any changes in your symptoms and the activities in which you participated in the 24 hours prior to the change. Sharing this information with your caregiver will allow him or her to  most efficiently treat your condition. These exercises may help you when beginning to rehabilitate your injury. Your symptoms may resolve with or without further involvement from your physician, physical therapist or athletic trainer. While completing these exercises, remember:   Restoring tissue flexibility helps normal motion to return to the joints. This allows healthier, less painful movement and activity.  An effective stretch should be held for at least 30 seconds.  A stretch should never be painful. You should only feel a gentle lengthening or release in the stretched tissue. FLEXION RANGE OF MOTION AND STRETCHING EXERCISES: STRETCH - Flexion, Single Knee to Chest   Lie on a firm bed or floor with both legs extended in front of you.  Keeping one leg in contact with the floor, bring your opposite knee to your chest. Hold your leg in place by either grabbing behind your thigh or at your knee.  Pull until you feel a gentle stretch in your low back. Hold __________ seconds.  Slowly release your grasp and repeat the exercise with the opposite side. Repeat __________ times. Complete this exercise __________ times per day.  STRETCH - Flexion, Double Knee to Chest  Lie on a firm bed or floor with both legs extended in front of you.  Keeping one leg in contact with the floor, bring your opposite knee to your chest.  Tense your stomach muscles to support your back and then lift your other knee to your chest. Hold your legs   in place by either grabbing behind your thighs or at your knees.  Pull both knees toward your chest until you feel a gentle stretch in your low back. Hold __________ seconds.  Tense your stomach muscles and slowly return one leg at a time to the floor. Repeat __________ times. Complete this exercise __________ times per day.  STRETCH - Low Trunk Rotation  Lie on a firm bed or floor. Keeping your legs in front of you, bend your knees so they are both pointed toward the  ceiling and your feet are flat on the floor.  Extend your arms out to the side. This will stabilize your upper body by keeping your shoulders in contact with the floor.  Gently and slowly drop both knees together to one side until you feel a gentle stretch in your low back. Hold for __________ seconds.  Tense your stomach muscles to support your lower back as you bring your knees back to the starting position. Repeat the exercise to the other side. Repeat __________ times. Complete this exercise __________ times per day  EXTENSION RANGE OF MOTION AND FLEXIBILITY EXERCISES: STRETCH - Extension, Prone on Elbows   Lie on your stomach on the floor, a bed will be too soft. Place your palms about shoulder width apart and at the height of your head.  Place your elbows under your shoulders. If this is too painful, stack pillows under your chest.  Allow your body to relax so that your hips drop lower and make contact more completely with the floor.  Hold this position for __________ seconds.  Slowly return to lying flat on the floor. Repeat __________ times. Complete this exercise __________ times per day.  RANGE OF MOTION - Extension, Prone Press Ups  Lie on your stomach on the floor, a bed will be too soft. Place your palms about shoulder width apart and at the height of your head.  Keeping your back as relaxed as possible, slowly straighten your elbows while keeping your hips on the floor. You may adjust the placement of your hands to maximize your comfort. As you gain motion, your hands will come more underneath your shoulders.  Hold this position __________ seconds.  Slowly return to lying flat on the floor. Repeat __________ times. Complete this exercise __________ times per day.  RANGE OF MOTION- Quadruped, Neutral Spine   Assume a hands and knees position on a firm surface. Keep your hands under your shoulders and your knees under your hips. You may place padding under your knees for  comfort.  Drop your head and point your tailbone toward the ground below you. This will round out your lower back like an angry cat. Hold this position for __________ seconds.  Slowly lift your head and release your tail bone so that your back sags into a large arch, like an old horse.  Hold this position for __________ seconds.  Repeat this until you feel limber in your low back.  Now, find your "sweet spot." This will be the most comfortable position somewhere between the two previous positions. This is your neutral spine. Once you have found this position, tense your stomach muscles to support your low back.  Hold this position for __________ seconds. Repeat __________ times. Complete this exercise __________ times per day.  STRENGTHENING EXERCISES - Low Back Sprain These exercises may help you when beginning to rehabilitate your injury. These exercises should be done near your "sweet spot." This is the neutral, low-back arch, somewhere between fully rounded   and fully arched, that is your least painful position. When performed in this safe range of motion, these exercises can be used for people who have either a flexion or extension based injury. These exercises may resolve your symptoms with or without further involvement from your physician, physical therapist or athletic trainer. While completing these exercises, remember:   Muscles can gain both the endurance and the strength needed for everyday activities through controlled exercises.  Complete these exercises as instructed by your physician, physical therapist or athletic trainer. Increase the resistance and repetitions only as guided.  You may experience muscle soreness or fatigue, but the pain or discomfort you are trying to eliminate should never worsen during these exercises. If this pain does worsen, stop and make certain you are following the directions exactly. If the pain is still present after adjustments, discontinue the  exercise until you can discuss the trouble with your caregiver. STRENGTHENING - Deep Abdominals, Pelvic Tilt   Lie on a firm bed or floor. Keeping your legs in front of you, bend your knees so they are both pointed toward the ceiling and your feet are flat on the floor.  Tense your lower abdominal muscles to press your low back into the floor. This motion will rotate your pelvis so that your tail bone is scooping upwards rather than pointing at your feet or into the floor. With a gentle tension and even breathing, hold this position for __________ seconds. Repeat __________ times. Complete this exercise __________ times per day.  STRENGTHENING - Abdominals, Crunches   Lie on a firm bed or floor. Keeping your legs in front of you, bend your knees so they are both pointed toward the ceiling and your feet are flat on the floor. Cross your arms over your chest.  Slightly tip your chin down without bending your neck.  Tense your abdominals and slowly lift your trunk high enough to just clear your shoulder blades. Lifting higher can put excessive stress on the lower back and does not further strengthen your abdominal muscles.  Control your return to the starting position. Repeat __________ times. Complete this exercise __________ times per day.  STRENGTHENING - Quadruped, Opposite UE/LE Lift   Assume a hands and knees position on a firm surface. Keep your hands under your shoulders and your knees under your hips. You may place padding under your knees for comfort.  Find your neutral spine and gently tense your abdominal muscles so that you can maintain this position. Your shoulders and hips should form a rectangle that is parallel with the floor and is not twisted.  Keeping your trunk steady, lift your right hand no higher than your shoulder and then your left leg no higher than your hip. Make sure you are not holding your breath. Hold this position for __________ seconds.  Continuing to keep  your abdominal muscles tense and your back steady, slowly return to your starting position. Repeat with the opposite arm and leg. Repeat __________ times. Complete this exercise __________ times per day.  STRENGTHENING - Abdominals and Quadriceps, Straight Leg Raise   Lie on a firm bed or floor with both legs extended in front of you.  Keeping one leg in contact with the floor, bend the other knee so that your foot can rest flat on the floor.  Find your neutral spine, and tense your abdominal muscles to maintain your spinal position throughout the exercise.  Slowly lift your straight leg off the floor about 6 inches for a count   of 15, making sure to not hold your breath.  Still keeping your neutral spine, slowly lower your leg all the way to the floor. Repeat this exercise with each leg __________ times. Complete this exercise __________ times per day. POSTURE AND BODY MECHANICS CONSIDERATIONS - Low Back Sprain Keeping correct posture when sitting, standing or completing your activities will reduce the stress put on different body tissues, allowing injured tissues a chance to heal and limiting painful experiences. The following are general guidelines for improved posture. Your physician or physical therapist will provide you with any instructions specific to your needs. While reading these guidelines, remember:  The exercises prescribed by your provider will help you have the flexibility and strength to maintain correct postures.  The correct posture provides the best environment for your joints to work. All of your joints have less wear and tear when properly supported by a spine with good posture. This means you will experience a healthier, less painful body.  Correct posture must be practiced with all of your activities, especially prolonged sitting and standing. Correct posture is as important when doing repetitive low-stress activities (typing) as it is when doing a single heavy-load  activity (lifting). RESTING POSITIONS Consider which positions are most painful for you when choosing a resting position. If you have pain with flexion-based activities (sitting, bending, stooping, squatting), choose a position that allows you to rest in a less flexed posture. You would want to avoid curling into a fetal position on your side. If your pain worsens with extension-based activities (prolonged standing, working overhead), avoid resting in an extended position such as sleeping on your stomach. Most people will find more comfort when they rest with their spine in a more neutral position, neither too rounded nor too arched. Lying on a non-sagging bed on your side with a pillow between your knees, or on your back with a pillow under your knees will often provide some relief. Keep in mind, being in any one position for a prolonged period of time, no matter how correct your posture, can still lead to stiffness. PROPER SITTING POSTURE In order to minimize stress and discomfort on your spine, you must sit with correct posture. Sitting with good posture should be effortless for a healthy body. Returning to good posture is a gradual process. Many people can work toward this most comfortably by using various supports until they have the flexibility and strength to maintain this posture on their own. When sitting with proper posture, your ears will fall over your shoulders and your shoulders will fall over your hips. You should use the back of the chair to support your upper back. Your lower back will be in a neutral position, just slightly arched. You may place a small pillow or folded towel at the base of your lower back for  support.  When working at a desk, create an environment that supports good, upright posture. Without extra support, muscles tire, which leads to excessive strain on joints and other tissues. Keep these recommendations in mind: CHAIR:  A chair should be able to slide under your desk  when your back makes contact with the back of the chair. This allows you to work closely.  The chair's height should allow your eyes to be level with the upper part of your monitor and your hands to be slightly lower than your elbows. BODY POSITION  Your feet should make contact with the floor. If this is not possible, use a foot rest.  Keep your   ears over your shoulders. This will reduce stress on your neck and low back. INCORRECT SITTING POSTURES  If you are feeling tired and unable to assume a healthy sitting posture, do not slouch or slump. This puts excessive strain on your back tissues, causing more damage and pain. Healthier options include:  Using more support, like a lumbar pillow.  Switching tasks to something that requires you to be upright or walking.  Talking a brief walk.  Lying down to rest in a neutral-spine position. PROLONGED STANDING WHILE SLIGHTLY LEANING FORWARD  When completing a task that requires you to lean forward while standing in one place for a long time, place either foot up on a stationary 2-4 inch high object to help maintain the best posture. When both feet are on the ground, the lower back tends to lose its slight inward curve. If this curve flattens (or becomes too large), then the back and your other joints will experience too much stress, tire more quickly, and can cause pain. CORRECT STANDING POSTURES Proper standing posture should be assumed with all daily activities, even if they only take a few moments, like when brushing your teeth. As in sitting, your ears should fall over your shoulders and your shoulders should fall over your hips. You should keep a slight tension in your abdominal muscles to brace your spine. Your tailbone should point down to the ground, not behind your body, resulting in an over-extended swayback posture.  INCORRECT STANDING POSTURES  Common incorrect standing postures include a forward head, locked knees and/or an excessive  swayback. WALKING Walk with an upright posture. Your ears, shoulders and hips should all line-up. PROLONGED ACTIVITY IN A FLEXED POSITION When completing a task that requires you to bend forward at your waist or lean over a low surface, try to find a way to stabilize 3 out of 4 of your limbs. You can place a hand or elbow on your thigh or rest a knee on the surface you are reaching across. This will provide you more stability, so that your muscles do not tire as quickly. By keeping your knees relaxed, or slightly bent, you will also reduce stress across your lower back. CORRECT LIFTING TECHNIQUES DO :  Assume a wide stance. This will provide you more stability and the opportunity to get as close as possible to the object which you are lifting.  Tense your abdominals to brace your spine. Bend at the knees and hips. Keeping your back locked in a neutral-spine position, lift using your leg muscles. Lift with your legs, keeping your back straight.  Test the weight of unknown objects before attempting to lift them.  Try to keep your elbows locked down at your sides in order get the best strength from your shoulders when carrying an object.  Always ask for help when lifting heavy or awkward objects. INCORRECT LIFTING TECHNIQUES DO NOT:   Lock your knees when lifting, even if it is a small object.  Bend and twist. Pivot at your feet or move your feet when needing to change directions.  Assume that you can safely pick up even a paperclip without proper posture. Document Released: 12/07/2005 Document Revised: 02/29/2012 Document Reviewed: 03/21/2009 [employer] Patient Information 2015 [employer]. This information is not intended to replace advice given to you by your health care provider. Make sure you discuss any questions you have with your health care provider.  

## 2015-10-03 ENCOUNTER — Ambulatory Visit (INDEPENDENT_AMBULATORY_CARE_PROVIDER_SITE_OTHER): Payer: 59 | Admitting: Adult Health

## 2015-10-03 ENCOUNTER — Encounter: Payer: Self-pay | Admitting: Adult Health

## 2015-10-03 VITALS — BP 114/80 | Temp 97.8°F | Ht 72.0 in | Wt 212.5 lb

## 2015-10-03 DIAGNOSIS — B029 Zoster without complications: Secondary | ICD-10-CM | POA: Diagnosis not present

## 2015-10-03 MED ORDER — VALACYCLOVIR HCL 1 G PO TABS: 1000.0000 mg | ORAL_TABLET | Freq: Three times a day (TID) | ORAL | Status: DC

## 2015-10-03 NOTE — Progress Notes (Signed)
Pre visit review using our clinic review tool, if applicable. No additional management support is needed unless otherwise documented below in the visit note. 

## 2015-10-03 NOTE — Patient Instructions (Addendum)
I have sent in a prescription for Valtrex. Take 1000 mg three times a day for 7 days.   If you need any pain medications please let me know.   Shingles Shingles, which is also known as herpes zoster, is an infection that causes a painful skin rash and fluid-filled blisters. Shingles is not related to genital herpes, which is a sexually transmitted infection.   Shingles only develops in people who:  Have had chickenpox.  Have received the chickenpox vaccine. (This is rare.) CAUSES Shingles is caused by varicella-zoster virus (VZV). This is the same virus that causes chickenpox. After exposure to VZV, the virus stays in the body in an inactive (dormant) state. Shingles develops if the virus reactivates. This can happen many years after the initial exposure to VZV. It is not known what causes this virus to reactivate. RISK FACTORS People who have had chickenpox or received the chickenpox vaccine are at risk for shingles. Infection is more common in people who:  Are older than age 62.  Have a weakened defense (immune) system, such as those with HIV, AIDS, or cancer.  Are taking medicines that weaken the immune system, such as transplant medicines.  Are under great stress. SYMPTOMS Early symptoms of this condition include itching, tingling, and pain in an area on your skin. Pain may be described as burning, stabbing, or throbbing. A few days or weeks after symptoms start, a painful red rash appears, usually on one side of the body in a bandlike or beltlike pattern. The rash eventually turns into fluid-filled blisters that break open, scab over, and dry up in about 2-3 weeks. At any time during the infection, you may also develop:  A fever.  Chills.  A headache.  An upset stomach. DIAGNOSIS This condition is diagnosed with a skin exam. Sometimes, skin or fluid samples are taken from the blisters before a diagnosis is made. These samples are examined under a microscope or sent to a lab  for testing. TREATMENT There is no specific cure for this condition. Your health care provider will probably prescribe medicines to help you manage pain, recover more quickly, and avoid long-term problems. Medicines may include:  Antiviral drugs.  Anti-inflammatory drugs.  Pain medicines. If the area involved is on your face, you may be referred to a specialist, such as an eye doctor (ophthalmologist) or an ear, nose, and throat (ENT) doctor to help you avoid eye problems, chronic pain, or disability. HOME CARE INSTRUCTIONS Medicines  Take medicines only as directed by your health care provider.  Apply an anti-itch or numbing cream to the affected area as directed by your health care provider. Blister and Rash Care  Take a cool bath or apply cool compresses to the area of the rash or blisters as directed by your health care provider. This may help with pain and itching.  Keep your rash covered with a loose bandage (dressing). Wear loose-fitting clothing to help ease the pain of material rubbing against the rash.  Keep your rash and blisters clean with mild soap and cool water or as directed by your health care provider.  Check your rash every day for signs of infection. These include redness, swelling, and pain that lasts or increases.  Do not pick your blisters.  Do not scratch your rash. General Instructions  Rest as directed by your health care provider.  Keep all follow-up visits as directed by your health care provider. This is important.  Until your blisters scab over, your infection can  cause chickenpox in people who have never had it or been vaccinated against it. To prevent this from happening, avoid contact with other people, especially:  Babies.  Pregnant women.  Children who have eczema.  Elderly people who have transplants.  People who have chronic illnesses, such as leukemia or AIDS. SEEK MEDICAL CARE IF:  Your pain is not relieved with prescribed  medicines.  Your pain does not get better after the rash heals.  Your rash looks infected. Signs of infection include redness, swelling, and pain that lasts or increases. SEEK IMMEDIATE MEDICAL CARE IF:  The rash is on your face or nose.  You have facial pain, pain around your eye area, or loss of feeling on one side of your face.  You have ear pain or you have ringing in your ear.  You have loss of taste.  Your condition gets worse.   This information is not intended to replace advice given to you by your health care provider. Make sure you discuss any questions you have with your health care provider.   Document Released: 12/07/2005 Document Revised: 12/28/2014 Document Reviewed: 10/18/2014 Elsevier Interactive Patient Education [employer].

## 2015-10-03 NOTE — Progress Notes (Signed)
Subjective:    Patient ID: Jesus Mccann, male    DOB: 1961-03-08, 54 y.o.   MRN: [id number]  HPI  54 year old male who presents to the office today for rash under left breast that wraps around to the back. The rash is only on the left side of his body, feels like a "sunburn". Has been present for less than 72 hours. No discharge or crusting.   Review of Systems  Constitutional: Negative.   HENT: Negative.   Skin: Positive for color change and rash. Negative for pallor and wound.  All other systems reviewed and are negative.  Past Medical History  Diagnosis Date  . CROHN'S DISEASE 01/30/2010  . HYPERTENSION 01/30/2010  . HYPOTHYROIDISM 01/30/2010    Social History   Social History  . Marital Status: Married    Spouse Name: N/A  . Number of Children: 1  . Years of Education: N/A   Occupational History  . Fireman    Social History Main Topics  . Smoking status: Former Smoker -- 0.50 packs/day for 6 years    Types: Cigarettes  . Smokeless tobacco: Never Used  . Alcohol Use: 0.0 oz/week    0 Standard drinks or equivalent per week     Comment: occasional beer per pt.  . Drug Use: No  . Sexual Activity: Not on file   Other Topics Concern  . Not on file   Social History Narrative    Past Surgical History  Procedure Laterality Date  . Bowel resection  20 yrs ago    resection x2=hx crohns.  . Laparoscopic lysis intestinal adhesions      Family History  Problem Relation Age of Onset  . Cancer Mother     breast cancer  . COPD Father   . Diabetes Father   . Colon cancer Neg Hx     No Known Allergies  Current Outpatient Prescriptions on File Prior to Visit  Medication Sig Dispense Refill  . ASACOL HD 800 MG TBEC TAKE 1 TABLET TWICE A DAY 180 tablet 2  . azaTHIOprine (IMURAN) 50 MG tablet Take 1 tablet (50 mg total) by mouth 2 (two) times daily. 180 tablet 3  . cyanocobalamin (,VITAMIN B-12,) 1000 MCG/ML injection Inject 1 mL (1,000 mcg total) into the muscle  once. Weekly for 6 weeks then monthly 30 mL 6  . cyclobenzaprine (FLEXERIL) 10 MG tablet Take 1 tablet (10 mg total) by mouth 3 (three) times daily as needed for muscle spasms. 30 tablet 0  . ergocalciferol (VITAMIN D2) 50000 UNITS capsule Take 1 capsule (50,000 Units total) by mouth once a week. 8 capsule 0  . levothyroxine (SYNTHROID, LEVOTHROID) 50 MCG tablet Take 1 tablet (50 mcg total) by mouth daily. 90 tablet 3  . Needles & Syringes MISC Please provide syringes and needles for B12 injections 20 each 0  . valsartan (DIOVAN) 160 MG tablet Take 1 tablet (160 mg total) by mouth daily. 90 tablet 3   No current facility-administered medications on file prior to visit.    BP 114/80 mmHg  Temp(Src) 97.8 F (36.6 C) (Oral)  Ht 6' (1.829 m)  Wt 212 lb 8 oz (96.389 kg)  BMI 28.81 kg/m2       Objective:   Physical Exam  Constitutional: He appears well-developed and well-nourished. No distress.  Musculoskeletal: Normal range of motion. He exhibits no edema or tenderness.  Neurological: He is alert.  Skin: Skin is warm and dry. Rash (red vessicle type rash under left  breast and left flank) noted. He is not diaphoretic. No erythema. No pallor.  Psychiatric: He has a normal mood and affect. His behavior is normal. Judgment and thought content normal.  Nursing note and vitals reviewed.      Assessment & Plan:  1. Shingles rash - Declined pain medication besides tylenol at this point. If he needs something stronger than he will call.  - valACYclovir (VALTREX) 1000 MG tablet; Take 1 tablet (1,000 mg total) by mouth 3 (three) times daily.  Dispense: 21 tablet; Refill: 0

## 2015-11-19 ENCOUNTER — Encounter: Payer: Self-pay | Admitting: Family Medicine

## 2015-11-19 ENCOUNTER — Ambulatory Visit (INDEPENDENT_AMBULATORY_CARE_PROVIDER_SITE_OTHER): Payer: 59 | Admitting: Family Medicine

## 2015-11-19 VITALS — BP 102/80 | HR 59 | Temp 98.3°F | Resp 14 | Ht 72.0 in | Wt 214.9 lb

## 2015-11-19 DIAGNOSIS — E039 Hypothyroidism, unspecified: Secondary | ICD-10-CM

## 2015-11-19 DIAGNOSIS — I10 Essential (primary) hypertension: Secondary | ICD-10-CM | POA: Diagnosis not present

## 2015-11-19 DIAGNOSIS — E538 Deficiency of other specified B group vitamins: Secondary | ICD-10-CM | POA: Diagnosis not present

## 2015-11-19 LAB — TSH: TSH: 1.33 u[IU]/mL (ref 0.35–4.50)

## 2015-11-19 MED ORDER — VALSARTAN 160 MG PO TABS: 160.0000 mg | ORAL_TABLET | Freq: Every day | ORAL | Status: DC

## 2015-11-19 MED ORDER — LEVOTHYROXINE SODIUM 50 MCG PO TABS: 50.0000 ug | ORAL_TABLET | Freq: Every day | ORAL | Status: DC

## 2015-11-19 MED ORDER — CYANOCOBALAMIN 1000 MCG/ML IJ SOLN: 1000.0000 ug | Freq: Once | INTRAMUSCULAR | Status: DC

## 2015-11-19 MED ORDER — NEEDLES & SYRINGES MISC: Status: DC

## 2015-11-19 MED ORDER — AZATHIOPRINE 50 MG PO TABS: 50.0000 mg | ORAL_TABLET | Freq: Two times a day (BID) | ORAL | Status: DC

## 2015-11-19 NOTE — Patient Instructions (Signed)

## 2015-11-19 NOTE — Progress Notes (Signed)
Pre visit review using our clinic review tool, if applicable. No additional management support is needed unless otherwise documented below in the visit note. 

## 2015-11-19 NOTE — Progress Notes (Signed)
   Subjective:    Patient ID: Jesus Mccann, male    DOB: July 29, 1961, 54 y.o.   MRN: [id number]  HPI Patient has history of Crohn's disease, hypertension, hypothyroidism. He had extremely low B12 when checked by GI last year with level of 10. He was prescribed medication for home replacement but never started except for very briefly. He has not been taking B12 for several months now. He denies any neuropathy symptoms.  No history of anemia. No fatigue issues.  Hypertension treated with valsartan. Blood pressure well controlled. No recent dizziness. No further syncopal episodes since several months ago. Workup unrevealing.  Hypothyroidism on levothyroid 50 g daily. Needs follow-up levels. Compliant with therapy. Declines flu vaccine.  Past Medical History  Diagnosis Date  . CROHN'S DISEASE 01/30/2010  . HYPERTENSION 01/30/2010  . HYPOTHYROIDISM 01/30/2010   Past Surgical History  Procedure Laterality Date  . Bowel resection  20 yrs ago    resection x2=hx crohns.  . Laparoscopic lysis intestinal adhesions      reports that he has quit smoking. His smoking use included Cigarettes. He has a 3 pack-year smoking history. He has never used smokeless tobacco. He reports that he drinks alcohol. He reports that he does not use illicit drugs. family history includes COPD in his father; Cancer in his mother; Diabetes in his father. There is no history of Colon cancer. No Known Allergies    Review of Systems  Constitutional: Negative for fatigue.  Eyes: Negative for visual disturbance.  Respiratory: Negative for cough, chest tightness and shortness of breath.   Cardiovascular: Negative for chest pain, palpitations and leg swelling.  Endocrine: Negative for cold intolerance.  Neurological: Negative for dizziness, syncope, weakness, light-headedness and headaches.       Objective:   Physical Exam  Constitutional: He is oriented to person, place, and time. He appears well-developed and  well-nourished.  HENT:  Right Ear: External ear normal.  Left Ear: External ear normal.  Mouth/Throat: Oropharynx is clear and moist.  Eyes: Pupils are equal, round, and reactive to light.  Neck: Neck supple. No thyromegaly present.  Cardiovascular: Normal rate and regular rhythm.   Pulmonary/Chest: Effort normal and breath sounds normal. No respiratory distress. He has no wheezes. He has no rales.  Musculoskeletal: He exhibits no edema.  Lymphadenopathy:    He has no cervical adenopathy.  Neurological: He is alert and oriented to person, place, and time.          Assessment & Plan:  #1 hypertension stable and at goal. Refill valsartan for one year #2 hypothyroidism. Recheck TSH. Refill levothyroxine for 1 year  #3 B12 deficiency. Patient has history of severe deficiency related to his Crohn's disease. Stressed importance of getting back on replacement we discussed potential adverse issues of not replacing this. He was given new prescription for B12 for home injections and will take 1000 g once weekly for 4 weeks then every other week for month and then monthly. Recheck B12 level in the spring in about 6 months

## 2015-12-30 ENCOUNTER — Telehealth: Payer: Self-pay | Admitting: Family Medicine

## 2015-12-30 NOTE — Telephone Encounter (Signed)
Pt request refill of the following: valsartan (DIOVAN) 160 MG tablet , azaTHIOprine (IMURAN) 50 MG tablet ,  levothyroxine (SYNTHROID, LEVOTHROID) 50 MCG tablet   Phamacy:  [facility]

## 2015-12-31 MED ORDER — VALSARTAN 160 MG PO TABS: 160.0000 mg | ORAL_TABLET | Freq: Every day | ORAL | Status: DC

## 2015-12-31 MED ORDER — AZATHIOPRINE 50 MG PO TABS: 50.0000 mg | ORAL_TABLET | Freq: Two times a day (BID) | ORAL | Status: DC

## 2015-12-31 MED ORDER — LEVOTHYROXINE SODIUM 50 MCG PO TABS: 50.0000 ug | ORAL_TABLET | Freq: Every day | ORAL | Status: DC

## 2015-12-31 NOTE — Telephone Encounter (Signed)
Rxs have been sent into pharmacy.

## 2016-01-10 MED FILL — CYANOCOBALAMIN 1,000 MCG/ML: 1000 | 28 days supply | Qty: 4 | Fill #1

## 2016-02-19 ENCOUNTER — Ambulatory Visit (INDEPENDENT_AMBULATORY_CARE_PROVIDER_SITE_OTHER): Payer: 59 | Admitting: Family Medicine

## 2016-02-19 ENCOUNTER — Encounter: Payer: Self-pay | Admitting: Family Medicine

## 2016-02-19 VITALS — BP 130/90 | HR 84 | Temp 97.9°F | Wt 208.3 lb

## 2016-02-19 DIAGNOSIS — S39012A Strain of muscle, fascia and tendon of lower back, initial encounter: Secondary | ICD-10-CM | POA: Diagnosis not present

## 2016-02-19 MED ORDER — TRAMADOL HCL 50 MG PO TABS
50.0000 mg | ORAL_TABLET | Freq: Four times a day (QID) | ORAL | Status: DC | PRN
Start: 2016-02-19 — End: 2018-01-26

## 2016-02-19 MED ORDER — CYCLOBENZAPRINE HCL 10 MG PO TABS: 10.0000 mg | ORAL_TABLET | Freq: Three times a day (TID) | ORAL | Status: DC | PRN

## 2016-02-19 MED FILL — traMADol HCL 50 MG TABS: 50 | 7 days supply | Qty: 30 | Fill #0

## 2016-02-19 MED FILL — CYCLOBENZAPRINE 10 MG TAB: 10 | 10 days supply | Qty: 30 | Fill #0

## 2016-02-19 NOTE — Progress Notes (Signed)
Pre visit review using our clinic review tool, if applicable. No additional management support is needed unless otherwise documented below in the visit note. 
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
## 2016-02-19 NOTE — Patient Instructions (Signed)
Low Back Sprain With Rehab A sprain is an injury in which a ligament is torn. The ligaments of the lower back are vulnerable to sprains. However, they are strong and require great force to be injured. These ligaments are important for stabilizing the spinal column. Sprains are classified into three categories. Grade 1 sprains cause pain, but the tendon is not lengthened. Grade 2 sprains include a lengthened ligament, due to the ligament being stretched or partially ruptured. With grade 2 sprains there is still function, although the function may be decreased. Grade 3 sprains involve a complete tear of the tendon or muscle, and function is usually impaired. SYMPTOMS   Severe pain in the lower back.  Sometimes, a feeling of a "pop," "snap," or tear, at the time of injury.  Tenderness and sometimes swelling at the injury site.  Uncommonly, bruising (contusion) within 48 hours of injury.  Muscle spasms in the back. CAUSES  Low back sprains occur when a force is placed on the ligaments that is greater than they can handle. Common causes of injury include:  Performing a stressful act while off-balance.  Repetitive stressful activities that involve movement of the lower back.  Direct hit (trauma) to the lower back. RISK INCREASES WITH:  Contact sports (football, wrestling).  Collisions (major skiing accidents).  Sports that require throwing or lifting (baseball, weightlifting).  Sports involving twisting of the spine (gymnastics, diving, tennis, golf).  Poor strength and flexibility.  Inadequate protection.  Previous back injury or surgery (especially fusion). PREVENTION  Wear properly fitted and padded protective equipment.  Warm up and stretch properly before activity.  Allow for adequate recovery between workouts.  Maintain physical fitness:  Strength, flexibility, and endurance.  Cardiovascular fitness.  Maintain a healthy body weight. PROGNOSIS  If treated properly,  low back sprains usually heal with non-surgical treatment. The length of time for healing depends on the severity of the injury.  RELATED COMPLICATIONS   Recurring symptoms, resulting in a chronic problem.  Chronic inflammation and pain in the low back.  Delayed healing or resolution of symptoms, especially if activity is resumed too soon.  Prolonged impairment.  Unstable or arthritic joints of the low back. TREATMENT  Treatment first involves the use of ice and medicine, to reduce pain and inflammation. The use of strengthening and stretching exercises may help reduce pain with activity. These exercises may be performed at home or with a therapist. Severe injuries may require referral to a therapist for further evaluation and treatment, such as ultrasound. Your caregiver may advise that you wear a back brace or corset, to help reduce pain and discomfort. Often, prolonged bed rest results in greater harm then benefit. Corticosteroid injections may be recommended. However, these should be reserved for the most serious cases. It is important to avoid using your back when lifting objects. At night, sleep on your back on a firm mattress, with a pillow placed under your knees. If non-surgical treatment is unsuccessful, surgery may be needed.  MEDICATION   If pain medicine is needed, nonsteroidal anti-inflammatory medicines (aspirin and ibuprofen), or other minor pain relievers (acetaminophen), are often advised.  Do not take pain medicine for 7 days before surgery.  Prescription pain relievers may be given, if your caregiver thinks they are needed. Use only as directed and only as much as you need.  Ointments applied to the skin may be helpful.  Corticosteroid injections may be given by your caregiver. These injections should be reserved for the most serious cases, because   they may only be given a certain number of times. HEAT AND COLD  Cold treatment (icing) should be applied for 10 to 15  minutes every 2 to 3 hours for inflammation and pain, and immediately after activity that aggravates your symptoms. Use ice packs or an ice massage.  Heat treatment may be used before performing stretching and strengthening activities prescribed by your caregiver, physical therapist, or athletic trainer. Use a heat pack or a warm water soak. SEEK MEDICAL CARE IF:   Symptoms get worse or do not improve in 2 to 4 weeks, despite treatment.  You develop numbness or weakness in either leg.  You lose bowel or bladder function.  Any of the following occur after surgery: fever, increased pain, swelling, redness, drainage of fluids, or bleeding in the affected area.  New, unexplained symptoms develop. (Drugs used in treatment may produce side effects.) EXERCISES  RANGE OF MOTION (ROM) AND STRETCHING EXERCISES - Low Back Sprain Most people with lower back pain will find that their symptoms get worse with excessive bending forward (flexion) or arching at the lower back (extension). The exercises that will help resolve your symptoms will focus on the opposite motion.  Your physician, physical therapist or athletic trainer will help you determine which exercises will be most helpful to resolve your lower back pain. Do not complete any exercises without first consulting with your caregiver. Discontinue any exercises which make your symptoms worse, until you speak to your caregiver. If you have pain, numbness or tingling which travels down into your buttocks, leg or foot, the goal of the therapy is for these symptoms to move closer to your back and eventually resolve. Sometimes, these leg symptoms will get better, but your lower back pain may worsen. This is often an indication of progress in your rehabilitation. Be very alert to any changes in your symptoms and the activities in which you participated in the 24 hours prior to the change. Sharing this information with your caregiver will allow him or her to most  efficiently treat your condition. These exercises may help you when beginning to rehabilitate your injury. Your symptoms may resolve with or without further involvement from your physician, physical therapist or athletic trainer. While completing these exercises, remember:   Restoring tissue flexibility helps normal motion to return to the joints. This allows healthier, less painful movement and activity.  An effective stretch should be held for at least 30 seconds.  A stretch should never be painful. You should only feel a gentle lengthening or release in the stretched tissue. FLEXION RANGE OF MOTION AND STRETCHING EXERCISES: STRETCH - Flexion, Single Knee to Chest   Lie on a firm bed or floor with both legs extended in front of you.  Keeping one leg in contact with the floor, bring your opposite knee to your chest. Hold your leg in place by either grabbing behind your thigh or at your knee.  Pull until you feel a gentle stretch in your low back. Hold __________ seconds.  Slowly release your grasp and repeat the exercise with the opposite side. Repeat __________ times. Complete this exercise __________ times per day.  STRETCH - Flexion, Double Knee to Chest  Lie on a firm bed or floor with both legs extended in front of you.  Keeping one leg in contact with the floor, bring your opposite knee to your chest.  Tense your stomach muscles to support your back and then lift your other knee to your chest. Hold your legs in   place by either grabbing behind your thighs or at your knees.  Pull both knees toward your chest until you feel a gentle stretch in your low back. Hold __________ seconds.  Tense your stomach muscles and slowly return one leg at a time to the floor. Repeat __________ times. Complete this exercise __________ times per day.  STRETCH - Low Trunk Rotation  Lie on a firm bed or floor. Keeping your legs in front of you, bend your knees so they are both pointed toward the  ceiling and your feet are flat on the floor.  Extend your arms out to the side. This will stabilize your upper body by keeping your shoulders in contact with the floor.  Gently and slowly drop both knees together to one side until you feel a gentle stretch in your low back. Hold for __________ seconds.  Tense your stomach muscles to support your lower back as you bring your knees back to the starting position. Repeat the exercise to the other side. Repeat __________ times. Complete this exercise __________ times per day  EXTENSION RANGE OF MOTION AND FLEXIBILITY EXERCISES: STRETCH - Extension, Prone on Elbows   Lie on your stomach on the floor, a bed will be too soft. Place your palms about shoulder width apart and at the height of your head.  Place your elbows under your shoulders. If this is too painful, stack pillows under your chest.  Allow your body to relax so that your hips drop lower and make contact more completely with the floor.  Hold this position for __________ seconds.  Slowly return to lying flat on the floor. Repeat __________ times. Complete this exercise __________ times per day.  RANGE OF MOTION - Extension, Prone Press Ups  Lie on your stomach on the floor, a bed will be too soft. Place your palms about shoulder width apart and at the height of your head.  Keeping your back as relaxed as possible, slowly straighten your elbows while keeping your hips on the floor. You may adjust the placement of your hands to maximize your comfort. As you gain motion, your hands will come more underneath your shoulders.  Hold this position __________ seconds.  Slowly return to lying flat on the floor. Repeat __________ times. Complete this exercise __________ times per day.  RANGE OF MOTION- Quadruped, Neutral Spine   Assume a hands and knees position on a firm surface. Keep your hands under your shoulders and your knees under your hips. You may place padding under your knees for  comfort.  Drop your head and point your tailbone toward the ground below you. This will round out your lower back like an angry cat. Hold this position for __________ seconds.  Slowly lift your head and release your tail bone so that your back sags into a large arch, like an old horse.  Hold this position for __________ seconds.  Repeat this until you feel limber in your low back.  Now, find your "sweet spot." This will be the most comfortable position somewhere between the two previous positions. This is your neutral spine. Once you have found this position, tense your stomach muscles to support your low back.  Hold this position for __________ seconds. Repeat __________ times. Complete this exercise __________ times per day.  STRENGTHENING EXERCISES - Low Back Sprain These exercises may help you when beginning to rehabilitate your injury. These exercises should be done near your "sweet spot." This is the neutral, low-back arch, somewhere between fully rounded and   fully arched, that is your least painful position. When performed in this safe range of motion, these exercises can be used for people who have either a flexion or extension based injury. These exercises may resolve your symptoms with or without further involvement from your physician, physical therapist or athletic trainer. While completing these exercises, remember:   Muscles can gain both the endurance and the strength needed for everyday activities through controlled exercises.  Complete these exercises as instructed by your physician, physical therapist or athletic trainer. Increase the resistance and repetitions only as guided.  You may experience muscle soreness or fatigue, but the pain or discomfort you are trying to eliminate should never worsen during these exercises. If this pain does worsen, stop and make certain you are following the directions exactly. If the pain is still present after adjustments, discontinue the  exercise until you can discuss the trouble with your caregiver. STRENGTHENING - Deep Abdominals, Pelvic Tilt   Lie on a firm bed or floor. Keeping your legs in front of you, bend your knees so they are both pointed toward the ceiling and your feet are flat on the floor.  Tense your lower abdominal muscles to press your low back into the floor. This motion will rotate your pelvis so that your tail bone is scooping upwards rather than pointing at your feet or into the floor. With a gentle tension and even breathing, hold this position for __________ seconds. Repeat __________ times. Complete this exercise __________ times per day.  STRENGTHENING - Abdominals, Crunches   Lie on a firm bed or floor. Keeping your legs in front of you, bend your knees so they are both pointed toward the ceiling and your feet are flat on the floor. Cross your arms over your chest.  Slightly tip your chin down without bending your neck.  Tense your abdominals and slowly lift your trunk high enough to just clear your shoulder blades. Lifting higher can put excessive stress on the lower back and does not further strengthen your abdominal muscles.  Control your return to the starting position. Repeat __________ times. Complete this exercise __________ times per day.  STRENGTHENING - Quadruped, Opposite UE/LE Lift   Assume a hands and knees position on a firm surface. Keep your hands under your shoulders and your knees under your hips. You may place padding under your knees for comfort.  Find your neutral spine and gently tense your abdominal muscles so that you can maintain this position. Your shoulders and hips should form a rectangle that is parallel with the floor and is not twisted.  Keeping your trunk steady, lift your right hand no higher than your shoulder and then your left leg no higher than your hip. Make sure you are not holding your breath. Hold this position for __________ seconds.  Continuing to keep  your abdominal muscles tense and your back steady, slowly return to your starting position. Repeat with the opposite arm and leg. Repeat __________ times. Complete this exercise __________ times per day.  STRENGTHENING - Abdominals and Quadriceps, Straight Leg Raise   Lie on a firm bed or floor with both legs extended in front of you.  Keeping one leg in contact with the floor, bend the other knee so that your foot can rest flat on the floor.  Find your neutral spine, and tense your abdominal muscles to maintain your spinal position throughout the exercise.  Slowly lift your straight leg off the floor about 6 inches for a count of   15, making sure to not hold your breath.  Still keeping your neutral spine, slowly lower your leg all the way to the floor. Repeat this exercise with each leg __________ times. Complete this exercise __________ times per day. POSTURE AND BODY MECHANICS CONSIDERATIONS - Low Back Sprain Keeping correct posture when sitting, standing or completing your activities will reduce the stress put on different body tissues, allowing injured tissues a chance to heal and limiting painful experiences. The following are general guidelines for improved posture. Your physician or physical therapist will provide you with any instructions specific to your needs. While reading these guidelines, remember:  The exercises prescribed by your provider will help you have the flexibility and strength to maintain correct postures.  The correct posture provides the best environment for your joints to work. All of your joints have less wear and tear when properly supported by a spine with good posture. This means you will experience a healthier, less painful body.  Correct posture must be practiced with all of your activities, especially prolonged sitting and standing. Correct posture is as important when doing repetitive low-stress activities (typing) as it is when doing a single heavy-load  activity (lifting). RESTING POSITIONS Consider which positions are most painful for you when choosing a resting position. If you have pain with flexion-based activities (sitting, bending, stooping, squatting), choose a position that allows you to rest in a less flexed posture. You would want to avoid curling into a fetal position on your side. If your pain worsens with extension-based activities (prolonged standing, working overhead), avoid resting in an extended position such as sleeping on your stomach. Most people will find more comfort when they rest with their spine in a more neutral position, neither too rounded nor too arched. Lying on a non-sagging bed on your side with a pillow between your knees, or on your back with a pillow under your knees will often provide some relief. Keep in mind, being in any one position for a prolonged period of time, no matter how correct your posture, can still lead to stiffness. PROPER SITTING POSTURE In order to minimize stress and discomfort on your spine, you must sit with correct posture. Sitting with good posture should be effortless for a healthy body. Returning to good posture is a gradual process. Many people can work toward this most comfortably by using various supports until they have the flexibility and strength to maintain this posture on their own. When sitting with proper posture, your ears will fall over your shoulders and your shoulders will fall over your hips. You should use the back of the chair to support your upper back. Your lower back will be in a neutral position, just slightly arched. You may place a small pillow or folded towel at the base of your lower back for  support.  When working at a desk, create an environment that supports good, upright posture. Without extra support, muscles tire, which leads to excessive strain on joints and other tissues. Keep these recommendations in mind: CHAIR:  A chair should be able to slide under your desk  when your back makes contact with the back of the chair. This allows you to work closely.  The chair's height should allow your eyes to be level with the upper part of your monitor and your hands to be slightly lower than your elbows. BODY POSITION  Your feet should make contact with the floor. If this is not possible, use a foot rest.  Keep your ears   over your shoulders. This will reduce stress on your neck and low back. INCORRECT SITTING POSTURES  If you are feeling tired and unable to assume a healthy sitting posture, do not slouch or slump. This puts excessive strain on your back tissues, causing more damage and pain. Healthier options include:  Using more support, like a lumbar pillow.  Switching tasks to something that requires you to be upright or walking.  Talking a brief walk.  Lying down to rest in a neutral-spine position. PROLONGED STANDING WHILE SLIGHTLY LEANING FORWARD  When completing a task that requires you to lean forward while standing in one place for a long time, place either foot up on a stationary 2-4 inch high object to help maintain the best posture. When both feet are on the ground, the lower back tends to lose its slight inward curve. If this curve flattens (or becomes too large), then the back and your other joints will experience too much stress, tire more quickly, and can cause pain. CORRECT STANDING POSTURES Proper standing posture should be assumed with all daily activities, even if they only take a few moments, like when brushing your teeth. As in sitting, your ears should fall over your shoulders and your shoulders should fall over your hips. You should keep a slight tension in your abdominal muscles to brace your spine. Your tailbone should point down to the ground, not behind your body, resulting in an over-extended swayback posture.  INCORRECT STANDING POSTURES  Common incorrect standing postures include a forward head, locked knees and/or an excessive  swayback. WALKING Walk with an upright posture. Your ears, shoulders and hips should all line-up. PROLONGED ACTIVITY IN A FLEXED POSITION When completing a task that requires you to bend forward at your waist or lean over a low surface, try to find a way to stabilize 3 out of 4 of your limbs. You can place a hand or elbow on your thigh or rest a knee on the surface you are reaching across. This will provide you more stability, so that your muscles do not tire as quickly. By keeping your knees relaxed, or slightly bent, you will also reduce stress across your lower back. CORRECT LIFTING TECHNIQUES DO :  Assume a wide stance. This will provide you more stability and the opportunity to get as close as possible to the object which you are lifting.  Tense your abdominals to brace your spine. Bend at the knees and hips. Keeping your back locked in a neutral-spine position, lift using your leg muscles. Lift with your legs, keeping your back straight.  Test the weight of unknown objects before attempting to lift them.  Try to keep your elbows locked down at your sides in order get the best strength from your shoulders when carrying an object.  Always ask for help when lifting heavy or awkward objects. INCORRECT LIFTING TECHNIQUES DO NOT:   Lock your knees when lifting, even if it is a small object.  Bend and twist. Pivot at your feet or move your feet when needing to change directions.  Assume that you can safely pick up even a paperclip without proper posture.   This information is not intended to replace advice given to you by your health care provider. Make sure you discuss any questions you have with your health care provider.   Document Released: 12/07/2005 Document Revised: 12/28/2014 Document Reviewed: 03/21/2009 Elsevier Interactive Patient Education 2016 [employer]  

## 2016-08-13 MED FILL — CYANOCOBALAMIN 1,000 MCG/ML: 1000 | 28 days supply | Qty: 4 | Fill #2

## 2016-11-25 ENCOUNTER — Ambulatory Visit (INDEPENDENT_AMBULATORY_CARE_PROVIDER_SITE_OTHER): Payer: 59 | Admitting: Family Medicine

## 2016-11-25 ENCOUNTER — Encounter: Payer: Self-pay | Admitting: Family Medicine

## 2016-11-25 VITALS — BP 100/70 | HR 75 | Temp 98.1°F | Ht 72.0 in | Wt 211.3 lb

## 2016-11-25 DIAGNOSIS — Z79899 Other long term (current) drug therapy: Secondary | ICD-10-CM | POA: Diagnosis not present

## 2016-11-25 DIAGNOSIS — I10 Essential (primary) hypertension: Secondary | ICD-10-CM

## 2016-11-25 DIAGNOSIS — Z23 Encounter for immunization: Secondary | ICD-10-CM

## 2016-11-25 DIAGNOSIS — E039 Hypothyroidism, unspecified: Secondary | ICD-10-CM

## 2016-11-25 MED ORDER — LEVOTHYROXINE SODIUM 50 MCG PO TABS: 50.0000 ug | ORAL_TABLET | Freq: Every day | ORAL | 3 refills | Status: DC

## 2016-11-25 MED ORDER — VALSARTAN 160 MG PO TABS: 160.0000 mg | ORAL_TABLET | Freq: Every day | ORAL | 3 refills | Status: DC

## 2016-11-25 MED ORDER — AZATHIOPRINE 50 MG PO TABS: 50.0000 mg | ORAL_TABLET | Freq: Two times a day (BID) | ORAL | 3 refills | Status: DC

## 2016-11-25 NOTE — Progress Notes (Signed)
Pre visit review using our clinic review tool, if applicable. No additional management support is needed unless otherwise documented below in the visit note. 

## 2016-11-25 NOTE — Progress Notes (Signed)
Subjective:     Patient ID: Jesus Mccann, male   DOB: 03-Jun-1961, 55 y.o.   MRN: [id number]  HPI Patient seen for medical follow-up. He has history of Crohn's disease, hypothyroidism, hypertension. Medications include levothyroid, valsartan and, and Imuran. He has not had any recent concerning GI symptoms. Needs flu vaccine. Blood pressure stable. No dizziness. No headaches. No recent chest pains. Compliant with all medications.  Denies any side effects.  Lives in [place] with his wife.  Works as a Airline pilot.  Ex-smoker.  Past Medical History:  Diagnosis Date  . CROHN'S DISEASE 01/30/2010  . HYPERTENSION 01/30/2010  . HYPOTHYROIDISM 01/30/2010   Past Surgical History:  Procedure Laterality Date  . BOWEL RESECTION  20 yrs ago   resection x2=hx crohns.  Marland Kitchen LAPAROSCOPIC LYSIS INTESTINAL ADHESIONS      reports that he has quit smoking. His smoking use included Cigarettes. He has a 3.00 pack-year smoking history. He has never used smokeless tobacco. He reports that he drinks alcohol. He reports that he does not use drugs. family history includes COPD in his father; Cancer in his mother; Diabetes in his father. No Known Allergies   Review of Systems  Constitutional: Negative for fatigue.  Eyes: Negative for visual disturbance.  Respiratory: Negative for cough, chest tightness and shortness of breath.   Cardiovascular: Negative for chest pain, palpitations and leg swelling.  Gastrointestinal: Negative for abdominal pain and diarrhea.  Endocrine: Negative for polydipsia and polyuria.  Neurological: Negative for dizziness, syncope, weakness, light-headedness and headaches.       Objective:   Physical Exam  Constitutional: He is oriented to person, place, and time. He appears well-developed and well-nourished.  HENT:  Right Ear: External ear normal.  Left Ear: External ear normal.  Mouth/Throat: Oropharynx is clear and moist.  Eyes: Pupils are equal, round, and reactive to light.  Neck:  Neck supple. No thyromegaly present.  Cardiovascular: Normal rate and regular rhythm.   Pulmonary/Chest: Effort normal and breath sounds normal. No respiratory distress. He has no wheezes. He has no rales.  Musculoskeletal: He exhibits no edema.  Neurological: He is alert and oriented to person, place, and time.       Assessment:     #1 hypothyroidism  #2 history of Crohn's disease  #3 hypertension stable and at goal    Plan:     -Flu vaccine given -Refill regular medications for one year -Check labs with TSH, CBC, basic metabolic panel, hepatic panel -Consider CPE at some point next year.  Eulas Post MD [facility]

## 2016-11-26 ENCOUNTER — Other Ambulatory Visit: Payer: Self-pay | Admitting: Family Medicine

## 2016-11-26 ENCOUNTER — Encounter: Payer: Self-pay | Admitting: Family Medicine

## 2016-11-26 LAB — LIPID PANEL
Cholesterol: 155 mg/dL (ref 0–200)
HDL: 57.1 mg/dL (ref >39.00)
LDL Cholesterol: 70 mg/dL (ref 0–99)
NONHDL: 98.35
Total CHOL/HDL Ratio: 3
Triglycerides: 142 mg/dL (ref 0.0–149.0)
VLDL: 28.4 mg/dL (ref 0.0–40.0)

## 2016-11-26 LAB — HEPATIC FUNCTION PANEL
ALK PHOS: 46 U/L (ref 39–117)
ALT: 19 U/L (ref 0–53)
AST: 23 U/L (ref 0–37)
Albumin: 4.3 g/dL (ref 3.5–5.2)
BILIRUBIN DIRECT: 0.1 mg/dL (ref 0.0–0.3)
BILIRUBIN TOTAL: 0.7 mg/dL (ref 0.2–1.2)
TOTAL PROTEIN: 6.9 g/dL (ref 6.0–8.3)

## 2016-11-26 LAB — BASIC METABOLIC PANEL
BUN: 10 mg/dL (ref 6–23)
CALCIUM: 9.3 mg/dL (ref 8.4–10.5)
CO2: 29 mEq/L (ref 19–32)
CREATININE: 1.09 mg/dL (ref 0.40–1.50)
Chloride: 105 mEq/L (ref 96–112)
GFR: 73.91 mL/min (ref >60.00)
GLUCOSE: 92 mg/dL (ref 70–99)
Potassium: 4.3 mEq/L (ref 3.5–5.1)
SODIUM: 141 meq/L (ref 135–145)

## 2016-11-26 LAB — TSH: TSH: 2.16 u[IU]/mL (ref 0.35–4.50)

## 2017-05-31 ENCOUNTER — Encounter: Payer: Self-pay | Admitting: *Deleted

## 2017-06-16 ENCOUNTER — Encounter: Payer: Self-pay | Admitting: Internal Medicine

## 2017-08-09 ENCOUNTER — Encounter: Payer: Self-pay | Admitting: Family Medicine

## 2017-08-10 MED ORDER — LOSARTAN POTASSIUM 100 MG PO TABS: 100.0000 mg | ORAL_TABLET | Freq: Every day | ORAL | 3 refills | Status: DC

## 2017-08-30 ENCOUNTER — Encounter: Payer: Self-pay | Admitting: Family Medicine

## 2017-09-06 MED ORDER — VALSARTAN 160 MG PO TABS: 160.0000 mg | ORAL_TABLET | Freq: Every day | ORAL | 0 refills | Status: DC

## 2017-09-06 MED FILL — VALSARTAN 160 MG TABLET: 160 | 30 days supply | Qty: 30 | Fill #0

## 2017-09-09 ENCOUNTER — Encounter: Payer: Self-pay | Admitting: Family Medicine

## 2017-10-13 ENCOUNTER — Ambulatory Visit: Payer: Self-pay | Admitting: Family Medicine

## 2017-10-15 ENCOUNTER — Ambulatory Visit (INDEPENDENT_AMBULATORY_CARE_PROVIDER_SITE_OTHER): Payer: 59

## 2017-10-15 DIAGNOSIS — Z23 Encounter for immunization: Secondary | ICD-10-CM | POA: Diagnosis not present

## 2017-11-06 ENCOUNTER — Other Ambulatory Visit: Payer: Self-pay | Admitting: Family Medicine

## 2017-12-31 MED FILL — PEG-3350 AND ELECTROLYTES S: 236 | 2 days supply | Qty: 4000 | Fill #0

## 2018-01-07 LAB — HM COLONOSCOPY

## 2018-01-14 ENCOUNTER — Encounter: Payer: Self-pay | Admitting: Family Medicine

## 2018-01-19 ENCOUNTER — Other Ambulatory Visit: Payer: Self-pay | Admitting: Family Medicine

## 2018-01-20 ENCOUNTER — Other Ambulatory Visit: Payer: Self-pay | Admitting: *Deleted

## 2018-01-20 MED ORDER — VALSARTAN 160 MG PO TABS: 160.0000 mg | ORAL_TABLET | Freq: Every day | ORAL | 0 refills | Status: DC

## 2018-01-20 MED ORDER — LEVOTHYROXINE SODIUM 50 MCG PO TABS: 50.0000 ug | ORAL_TABLET | Freq: Every day | ORAL | 0 refills | Status: DC

## 2018-01-20 NOTE — Telephone Encounter (Signed)
Refills for Levothyroxine and Valsartan sent to [facility] as the pt needs an appt

## 2018-01-26 ENCOUNTER — Encounter: Payer: Self-pay | Admitting: Family Medicine

## 2018-01-26 ENCOUNTER — Ambulatory Visit (INDEPENDENT_AMBULATORY_CARE_PROVIDER_SITE_OTHER): Payer: 59 | Admitting: Family Medicine

## 2018-01-26 VITALS — BP 120/72 | HR 64 | Temp 98.1°F | Ht 72.0 in | Wt 210.0 lb

## 2018-01-26 DIAGNOSIS — Z125 Encounter for screening for malignant neoplasm of prostate: Secondary | ICD-10-CM | POA: Diagnosis not present

## 2018-01-26 DIAGNOSIS — I10 Essential (primary) hypertension: Secondary | ICD-10-CM

## 2018-01-26 DIAGNOSIS — K5090 Crohn's disease, unspecified, without complications: Secondary | ICD-10-CM

## 2018-01-26 DIAGNOSIS — E039 Hypothyroidism, unspecified: Secondary | ICD-10-CM

## 2018-01-26 LAB — PSA: PSA: 0.72 ng/mL (ref 0.10–4.00)

## 2018-01-26 MED ORDER — LEVOTHYROXINE SODIUM 50 MCG PO TABS: 50.0000 ug | ORAL_TABLET | Freq: Every day | ORAL | 3 refills | Status: DC

## 2018-01-26 MED ORDER — VALSARTAN 160 MG PO TABS: 160.0000 mg | ORAL_TABLET | Freq: Every day | ORAL | 3 refills | Status: DC

## 2018-01-26 NOTE — Progress Notes (Signed)
Subjective:     Patient ID: Jesus Mccann, male   DOB: 1961-11-12, 57 y.o.   MRN: [id number]  HPI  Patient seen for medical follow-up. His chronic problems include history of hypothyroidism, hypertension, Crohn's disease. He had recent colonoscopy and December. Apparently had poor prep. Recommended three-year follow-up. No polyps or other abnormalities noted. Has been on Imuran for several years which CV working well. Needs refills.  Takes Diovan for hypertension and blood pressure stable. No dizziness or headaches. No chest pains.  Hypothyroidism on replacement. Patient had recent labs through GI and these were reviewed and included TSH which was normal at 1.6. Other lab stable. Has not had PSA screening in a few years. No slow stream or other concerns symptom wise  Past Medical History:  Diagnosis Date  . CROHN'S DISEASE 01/30/2010  . HYPERTENSION 01/30/2010  . HYPOTHYROIDISM 01/30/2010   Past Surgical History:  Procedure Laterality Date  . BOWEL RESECTION  20 yrs ago   resection x2=hx crohns.  Marland Kitchen LAPAROSCOPIC LYSIS INTESTINAL ADHESIONS      reports that he has quit smoking. His smoking use included cigarettes. He has a 3.00 pack-year smoking history. he has never used smokeless tobacco. He reports that he drinks alcohol. He reports that he does not use drugs. family history includes COPD in his father; Cancer in his mother; Diabetes in his father. No Known Allergies  Review of Systems  Constitutional: Negative for fatigue.  Eyes: Negative for visual disturbance.  Respiratory: Negative for cough, chest tightness and shortness of breath.   Cardiovascular: Negative for chest pain, palpitations and leg swelling.  Gastrointestinal: Negative for abdominal pain.  Endocrine: Negative for polydipsia and polyuria.  Genitourinary: Negative for difficulty urinating and dysuria.  Neurological: Negative for dizziness, syncope, weakness, light-headedness and headaches.       Objective:   Physical Exam  Constitutional: He is oriented to person, place, and time. He appears well-developed and well-nourished.  HENT:  Right Ear: External ear normal.  Left Ear: External ear normal.  Mouth/Throat: Oropharynx is clear and moist.  Eyes: Pupils are equal, round, and reactive to light.  Neck: Neck supple. No thyromegaly present.  Cardiovascular: Normal rate and regular rhythm.  Pulmonary/Chest: Effort normal and breath sounds normal. No respiratory distress. He has no wheezes. He has no rales.  Musculoskeletal: He exhibits no edema.  Neurological: He is alert and oriented to person, place, and time.       Assessment:     #1 hypertension stable and at goal  #2 hypothyroidism with recent TSH normal through labs that were done December 4 through GI  #3 Crohn's disease-stable with recent colonoscopy as above    Plan:     -Refilled his Diovan and Imuran for one year. He does not need refills of levothyroxin at this time -The natural history of prostate cancer and ongoing controversy regarding screening and potential treatment outcomes of prostate cancer has been discussed with the patient. The meaning of a false positive PSA and a false negative PSA has been discussed. He indicates understanding of the limitations of this screening test and wishes to proceed with screening PSA testing.  Eulas Post MD [facility] at [facility]

## 2018-01-29 ENCOUNTER — Encounter (HOSPITAL_COMMUNITY): Payer: Self-pay

## 2018-01-29 ENCOUNTER — Emergency Department (HOSPITAL_COMMUNITY): Payer: 59

## 2018-01-29 ENCOUNTER — Emergency Department (HOSPITAL_COMMUNITY)
Admission: EM | Admit: 2018-01-29 | Discharge: 2018-01-29 | Disposition: A | Discharge status: Home / Self Care | Payer: 59 | Attending: Emergency Medicine | Admitting: Emergency Medicine

## 2018-01-29 DIAGNOSIS — Z87891 Personal history of nicotine dependence: Secondary | ICD-10-CM | POA: Insufficient documentation

## 2018-01-29 DIAGNOSIS — I10 Essential (primary) hypertension: Secondary | ICD-10-CM | POA: Insufficient documentation

## 2018-01-29 DIAGNOSIS — E039 Hypothyroidism, unspecified: Secondary | ICD-10-CM | POA: Insufficient documentation

## 2018-01-29 DIAGNOSIS — N132 Hydronephrosis with renal and ureteral calculous obstruction: Secondary | ICD-10-CM | POA: Insufficient documentation

## 2018-01-29 DIAGNOSIS — Z79899 Other long term (current) drug therapy: Secondary | ICD-10-CM | POA: Diagnosis not present

## 2018-01-29 DIAGNOSIS — R109 Unspecified abdominal pain: Secondary | ICD-10-CM | POA: Diagnosis present

## 2018-01-29 DIAGNOSIS — N23 Unspecified renal colic: Secondary | ICD-10-CM

## 2018-01-29 MED ORDER — ONDANSETRON 8 MG PO TBDP: 8.0000 mg | ORAL_TABLET | Freq: Three times a day (TID) | ORAL | 0 refills | Status: DC | PRN

## 2018-01-29 MED ORDER — OXYCODONE-ACETAMINOPHEN 5-325 MG PO TABS: 1.0000 | ORAL_TABLET | ORAL | 0 refills | Status: DC | PRN

## 2018-01-29 MED ORDER — OXYCODONE-ACETAMINOPHEN 5-325 MG PO TABS
1.0000 | ORAL_TABLET | ORAL | Status: DC | PRN
  Administered 2018-01-29: 1 via ORAL
  Filled 2018-01-29: qty 1

## 2018-01-29 MED ORDER — OXYCODONE-ACETAMINOPHEN 5-325 MG PO TABS: 1.0000 | ORAL_TABLET | Freq: Once | ORAL | Status: DC

## 2018-01-29 MED ORDER — TAMSULOSIN HCL 0.4 MG PO CAPS: 0.4000 mg | ORAL_CAPSULE | Freq: Every day | ORAL | 0 refills | Status: DC

## 2018-01-29 NOTE — ED Notes (Signed)
Bed: [facility] Expected date:  Expected time:  Means of arrival:  Comments: 

## 2018-01-29 NOTE — ED Triage Notes (Signed)
Pt with left flank pain.  Nausea with no vomiting.  No change in urination.  No fever.

## 2018-01-30 NOTE — ED Provider Notes (Signed)
[facility] DEPT Provider Note   CSN: [id number] Arrival date & time: 01/29/18  1136     History   Chief Complaint Chief Complaint  Patient presents with  . Flank Pain    HPI Jesus Mccann is a 57 y.o. male.  HPI Patient presents the emergency department with acute onset left-sided flank pain with radiation to urination.  Denies fevers and chills.  Patient never had pain like this.  No history of kidney stones.  Does have a history of Crohn's disease.  On arrival to the emergency department the patient states the pain.  He is much more comfortable at this time after triage.   Past Medical History:  Diagnosis Date  . CROHN'S DISEASE 01/30/2010  . HYPERTENSION 01/30/2010  . HYPOTHYROIDISM 01/30/2010    Patient Active Problem List   Diagnosis Date Noted  . B12 deficiency 11/19/2015  . Biceps tendon tear 10/30/2013  . Subacromial bursitis 10/30/2013  . Hypothyroidism 01/30/2010  . Essential hypertension 01/30/2010  . Regional enteritis [facility]) 01/30/2010    Past Surgical History:  Procedure Laterality Date  . BOWEL RESECTION  20 yrs ago   resection x2=hx crohns.  Marland Kitchen LAPAROSCOPIC LYSIS INTESTINAL ADHESIONS         Home Medications    Prior to Admission medications   Medication Sig Start Date End Date Taking? Authorizing Provider  azaTHIOprine (IMURAN) 50 MG tablet TAKE 1 TABLET TWICE A DAY 11/08/17   Burchette, Alinda Sierras, MD  cyclobenzaprine (FLEXERIL) 10 MG tablet Take 1 tablet (10 mg total) by mouth 3 (three) times daily as needed for muscle spasms. 02/19/16   Burchette, Alinda Sierras, MD  levothyroxine (SYNTHROID, LEVOTHROID) 50 MCG tablet Take 1 tablet (50 mcg total) by mouth daily. 01/26/18   Burchette, Alinda Sierras, MD  Needles & Syringes MISC Please provide syringes and needles for B12 injections Patient not taking: Reported on 01/26/2018 11/19/15   Eulas Post, MD  ondansetron (ZOFRAN ODT) 8 MG disintegrating tablet Take 1 tablet (8 mg total)  by mouth every 8 (eight) hours as needed for nausea or vomiting. 01/29/18   Jola Schmidt, MD  oxyCODONE-acetaminophen (PERCOCET/ROXICET) 5-325 MG tablet Take 1 tablet by mouth every 4 (four) hours as needed for severe pain. 01/29/18   Jola Schmidt, MD  tamsulosin (FLOMAX) 0.4 MG CAPS capsule Take 1 capsule (0.4 mg total) by mouth daily. 01/29/18   Jola Schmidt, MD  valsartan (DIOVAN) 160 MG tablet Take 1 tablet (160 mg total) by mouth daily. 01/26/18   Burchette, Alinda Sierras, MD    Family History Family History  Problem Relation Age of Onset  . Cancer Mother        breast cancer  . COPD Father   . Diabetes Father   . Colon cancer Neg Hx     Social History Social History   Tobacco Use  . Smoking status: Former Smoker    Packs/day: 0.50    Years: 6.00    Pack years: 3.00    Types: Cigarettes  . Smokeless tobacco: Never Used  Substance Use Topics  . Alcohol use: Yes    Alcohol/week: 0.0 oz    Comment: occasional beer per pt.  . Drug use: No     Allergies   Patient has no known allergies.   Review of Systems Review of Systems  All other systems reviewed and are negative.    Physical Exam Updated Vital Signs BP 133/90 (BP Location: Left Arm)   Pulse (!) 52  Temp (!) 97.5 F (36.4 C) (Oral)   Resp 14   SpO2 100%   Physical Exam  Constitutional: He is oriented to person, place, and time. He appears well-developed and well-nourished.  HENT:  Head: Normocephalic and atraumatic.  Eyes: EOM are normal.  Neck: Normal range of motion.  Cardiovascular: Normal rate, regular rhythm, normal heart sounds and intact distal pulses.  Pulmonary/Chest: Effort normal and breath sounds normal. No respiratory distress.  Abdominal: Soft. He exhibits no distension. There is no tenderness.  Musculoskeletal: Normal range of motion.  Neurological: He is alert and oriented to person, place, and time.  Skin: Skin is warm and dry.  Psychiatric: He has a normal mood and affect. Judgment normal.   Nursing note and vitals reviewed.    ED Treatments / Results  Labs (all labs ordered are listed, but only abnormal results are displayed) Labs Reviewed - No data to display  EKG  EKG Interpretation None       Radiology Ct Renal Stone Study  Result Date: 01/29/2018 CLINICAL DATA:  57 year old male with acute left flank and abdominal pain. EXAM: CT ABDOMEN AND PELVIS WITHOUT CONTRAST TECHNIQUE: Multidetector CT imaging of the abdomen and pelvis was performed following the standard protocol without IV contrast. COMPARISON:  None. FINDINGS: Please note that parenchymal abnormalities may be missed without intravenous contrast. Lower chest: No acute abnormality Hepatobiliary: Hepatic cysts are present without other hepatic abnormality. Gallbladder is unremarkable. No biliary dilatation. Pancreas: Unremarkable Spleen: Unremarkable Adrenals/Urinary Tract: A 8 mm left UPJ calculus causes mild left hydronephrosis. A 6 mm nonobstructing mid left renal calculus is present. The right kidney, adrenal glands and bladder are unremarkable. Stomach/Bowel: No bowel obstruction, bowel wall thickening or inflammatory changes. Bowel surgical changes are present. Vascular/Lymphatic: Aortic atherosclerosis. No enlarged abdominal or pelvic lymph nodes. Reproductive: Prostate is unremarkable. Other: No ascites, focal collection or pneumoperitoneum. Musculoskeletal: No acute or significant osseous findings. IMPRESSION: 1. 8 mm left UPJ calculus causing mild left hydronephrosis. 2. Nonobstructing 6 mm mid left renal calculus 3.  Aortic Atherosclerosis (ICD10-I70.0). Electronically Signed   By: Margarette Canada M.D.   On: 01/29/2018 12:43    Procedures Procedures (including critical care time)  Medications Ordered in ED Medications - No data to display   Initial Impression / Assessment and Plan / ED Course  I have reviewed the triage vital signs and the nursing notes.  Pertinent labs & imaging results that were  available during my care of the patient were reviewed by me and considered in my medical decision making (see chart for details).     CT scan demonstrates 8 mm left UPJ stone.  Patient is pain-free at this time.  Outpatient urology follow-up.  Standard stone  Final Clinical Impressions(s) / ED Diagnoses   Final diagnoses:  Renal colic    ED Discharge Orders        Ordered    oxyCODONE-acetaminophen (PERCOCET/ROXICET) 5-325 MG tablet  Every 4 hours PRN     01/29/18 1544    ondansetron (ZOFRAN ODT) 8 MG disintegrating tablet  Every 8 hours PRN     01/29/18 1544    tamsulosin (FLOMAX) 0.4 MG CAPS capsule  Daily     01/29/18 1544       Jola Schmidt, MD 01/30/18 1550

## 2018-01-31 ENCOUNTER — Other Ambulatory Visit: Payer: Self-pay | Admitting: Urology

## 2018-02-01 ENCOUNTER — Encounter (HOSPITAL_COMMUNITY): Payer: Self-pay | Admitting: General Practice

## 2018-02-03 ENCOUNTER — Encounter (HOSPITAL_COMMUNITY): Payer: Self-pay | Admitting: General Practice

## 2018-02-03 ENCOUNTER — Ambulatory Visit (HOSPITAL_COMMUNITY)
Admission: RE | Admit: 2018-02-03 | Discharge: 2018-02-03 | Disposition: A | Discharge status: Home / Self Care | Payer: 59 | Source: Ambulatory Visit | Attending: Urology | Admitting: Urology

## 2018-02-03 ENCOUNTER — Encounter (HOSPITAL_COMMUNITY)
Admission: RE | Disposition: A | Discharge status: Home / Self Care | Payer: Self-pay | Source: Ambulatory Visit | Attending: Urology

## 2018-02-03 ENCOUNTER — Ambulatory Visit (HOSPITAL_COMMUNITY): Payer: 59

## 2018-02-03 DIAGNOSIS — I10 Essential (primary) hypertension: Secondary | ICD-10-CM | POA: Diagnosis not present

## 2018-02-03 DIAGNOSIS — M109 Gout, unspecified: Secondary | ICD-10-CM | POA: Insufficient documentation

## 2018-02-03 DIAGNOSIS — Z888 Allergy status to other drugs, medicaments and biological substances status: Secondary | ICD-10-CM | POA: Insufficient documentation

## 2018-02-03 DIAGNOSIS — Z79899 Other long term (current) drug therapy: Secondary | ICD-10-CM | POA: Diagnosis not present

## 2018-02-03 DIAGNOSIS — N200 Calculus of kidney: Secondary | ICD-10-CM

## 2018-02-03 DIAGNOSIS — N201 Calculus of ureter: Secondary | ICD-10-CM | POA: Insufficient documentation

## 2018-02-03 HISTORY — PX: EXTRACORPOREAL SHOCK WAVE LITHOTRIPSY: SHX1557

## 2018-02-03 HISTORY — DX: Personal history of urinary calculi: Z87.442

## 2018-02-03 SURGERY — LITHOTRIPSY, ESWL
Anesthesia: LOCAL | Laterality: Left

## 2018-02-03 MED ORDER — DIPHENHYDRAMINE HCL 25 MG PO CAPS
25.0000 mg | ORAL_CAPSULE | ORAL | Status: AC
  Administered 2018-02-03: 25 mg via ORAL
  Filled 2018-02-03: qty 1

## 2018-02-03 MED ORDER — SODIUM CHLORIDE 0.9 % IV SOLN
INTRAVENOUS | Status: DC
  Administered 2018-02-03: 10:00:00 via INTRAVENOUS

## 2018-02-03 MED ORDER — CIPROFLOXACIN HCL 500 MG PO TABS
500.0000 mg | ORAL_TABLET | ORAL | Status: AC
  Administered 2018-02-03: 500 mg via ORAL
  Filled 2018-02-03: qty 1

## 2018-02-03 MED ORDER — DIAZEPAM 5 MG PO TABS
10.0000 mg | ORAL_TABLET | ORAL | Status: AC
  Administered 2018-02-03: 10 mg via ORAL
  Filled 2018-02-03: qty 2

## 2018-02-03 NOTE — Discharge Instructions (Signed)
Lithotripsy, Care After This sheet gives you information about how to care for yourself after your procedure. Your health care provider may also give you more specific instructions. If you have problems or questions, contact your health care provider. What can I expect after the procedure? After the procedure, it is common to have:  Some blood in your urine. This should only last for a few days.  Soreness in your back, sides, or upper abdomen for a few days.  Blotches or bruises on your back where the pressure wave entered the skin.  Pain, discomfort, or nausea when pieces (fragments) of the kidney stone move through the tube that carries urine from the kidney to the bladder (ureter). Stone fragments may pass soon after the procedure, but they may continue to pass for up to 4-8 weeks. ? If you have severe pain or nausea, contact your health care provider. This may be caused by a large stone that was not broken up, and this may mean that you need more treatment.  Some pain or discomfort during urination.  Some pain or discomfort in the lower abdomen or (in men) at the base of the penis.  Follow these instructions at home: Medicines  Take over-the-counter and prescription medicines only as told by your health care provider.  If you were prescribed an antibiotic medicine, take it as told by your health care provider. Do not stop taking the antibiotic even if you start to feel better.  Do not drive for 24 hours if you were given a medicine to help you relax (sedative).  Do not drive or use heavy machinery while taking prescription pain medicine. Eating and drinking  Drink enough water and fluids to keep your urine clear or pale yellow. This helps any remaining pieces of the stone to pass. It can also help prevent new stones from forming.  Eat plenty of fresh fruits and vegetables.  Follow instructions from your health care provider about eating and drinking restrictions. You may be  instructed: ? To reduce how much salt (sodium) you eat or drink. Check ingredients and nutrition facts on packaged foods and beverages. ? To reduce how much meat you eat.  Eat the recommended amount of calcium for your age and gender. Ask your health care provider how much calcium you should have. General instructions  Get plenty of rest.  Most people can resume normal activities 1-2 days after the procedure. Ask your health care provider what activities are safe for you.  If directed, strain all urine through the strainer that was provided by your health care provider. ? Keep all fragments for your health care provider to see. Any stones that are found may be sent to a medical lab for examination. The stone may be as small as a grain of salt.  Keep all follow-up visits as told by your health care provider. This is important. Contact a health care provider if:  You have pain that is severe or does not get better with medicine.  You have nausea that is severe or does not go away.  You have blood in your urine longer than your health care provider told you to expect.  You have more blood in your urine.  You have pain during urination that does not go away.  You urinate more frequently than usual and this does not go away.  You develop a rash or any other possible signs of an allergic reaction. Get help right away if:  You have severe pain in  your back, sides, or upper abdomen.  You have severe pain while urinating.  Your urine is very dark red.  You have blood in your stool (feces).  You cannot pass any urine at all.  You feel a strong urge to urinate after emptying your bladder.  You have a fever or chills.  You develop shortness of breath, difficulty breathing, or chest pain.  You have severe nausea that leads to persistent vomiting.  You faint. Summary  After this procedure, it is common to have some pain, discomfort, or nausea when pieces (fragments) of the  kidney stone move through the tube that carries urine from the kidney to the bladder (ureter). If this pain or nausea is severe, however, you should contact your health care provider.  Most people can resume normal activities 1-2 days after the procedure. Ask your health care provider what activities are safe for you.  Drink enough water and fluids to keep your urine clear or pale yellow. This helps any remaining pieces of the stone to pass, and it can help prevent new stones from forming.  If directed, strain your urine and keep all fragments for your health care provider to see. Fragments or stones may be as small as a grain of salt.  Get help right away if you have severe pain in your back, sides, or upper abdomen or have severe pain while urinating. This information is not intended to replace advice given to you by your health care provider. Make sure you discuss any questions you have with your health care provider. Document Released: 12/27/2007 Document Revised: 10/28/2016 Document Reviewed: 10/28/2016 Elsevier Interactive Patient Education  [employer].

## 2018-02-04 ENCOUNTER — Encounter (HOSPITAL_COMMUNITY): Payer: Self-pay | Admitting: Urology

## 2018-03-02 ENCOUNTER — Other Ambulatory Visit: Payer: Self-pay | Admitting: Family Medicine

## 2018-03-02 NOTE — Telephone Encounter (Signed)
Refill for 6 months. 

## 2018-05-20 ENCOUNTER — Ambulatory Visit (INDEPENDENT_AMBULATORY_CARE_PROVIDER_SITE_OTHER): Payer: 59 | Admitting: Family Medicine

## 2018-05-20 ENCOUNTER — Encounter: Payer: Self-pay | Admitting: Family Medicine

## 2018-05-20 VITALS — BP 150/80 | HR 65 | Temp 98.3°F | Wt 210.6 lb

## 2018-05-20 DIAGNOSIS — M79652 Pain in left thigh: Secondary | ICD-10-CM | POA: Diagnosis not present

## 2018-05-20 MED ORDER — PREDNISONE 10 MG PO TABS: ORAL_TABLET | ORAL | 0 refills | Status: DC

## 2018-05-20 MED FILL — predniSONE 10 MG TABS: 10 | 9 days supply | Qty: 24 | Fill #0

## 2018-05-20 NOTE — Progress Notes (Signed)
  Subjective:     Patient ID: Jesus Mccann, male   DOB: 22-Nov-1961, 57 y.o.   MRN: [id number]  HPI  Patient seen with left thigh pain which radiates from his lower back region to about the knee. No injury. Had some chronic back pain off and on for months but started developing current pain about 5 days ago. No skin rash. Quality is sharp and achy. He thinks he might have had some mild weakness. Pain is worse with movement. He tried some Flexeril and Aleve without much relief.  Denies any urine or stool incontinence. No fevers or chills. No pain around the hip joint region  Past Medical History:  Diagnosis Date  . CROHN'S DISEASE 01/30/2010  . History of kidney stones   . HYPERTENSION 01/30/2010  . HYPOTHYROIDISM 01/30/2010   Past Surgical History:  Procedure Laterality Date  . BOWEL RESECTION  20 yrs ago   resection x2=hx crohns.  Marland Kitchen EXTRACORPOREAL SHOCK WAVE LITHOTRIPSY Left 02/03/2018   Procedure: LEFT EXTRACORPOREAL SHOCK WAVE LITHOTRIPSY (ESWL);  Surgeon: Cleon Gustin, MD;  Location: [facility];  Service: Urology;  Laterality: Left;  . LAPAROSCOPIC LYSIS INTESTINAL ADHESIONS      reports that he has quit smoking. His smoking use included cigarettes. He has a 3.00 pack-year smoking history. He has never used smokeless tobacco. He reports that he drinks alcohol. He reports that he does not use drugs. family history includes COPD in his father; Cancer in his mother; Diabetes in his father. Allergies  Allergen Reactions  . Losartan Other (See Comments)    weakness    Review of Systems  Constitutional: Negative for chills and fever.  Gastrointestinal: Negative for abdominal pain.  Musculoskeletal: Positive for back pain.  Skin: Negative for rash.  Neurological: Positive for weakness. Negative for numbness.       Objective:   Physical Exam  Constitutional: He appears well-developed and well-nourished.  Cardiovascular: Normal rate and regular rhythm.  Musculoskeletal: He  exhibits no edema.  Straight leg raises are negative bilaterally. No muscle atrophy. Good range of motion left hip. No lateral hip tenderness.  Neurological:  Trace Achilles reflex bilaterally and 2+ knee reflux. Full-strength with plantar flexion, dorsiflexion, and knee extension bilaterally as well as full strength with hip flexion  Skin: No rash noted.       Assessment:     Left thigh pain. Question lumbar radiculitis. Doubt hip joint pathology. Does not have any rash to suggest shingles    Plan:     -Recommend trial of prednisone taper starting at 40 mg daily -May continue with muscle relaxer at night as needed and may supplement with Tylenol -Avoid concomitant use of nonsteroidal (while on prednisone) -Follow-up immediately for any weakness, rash, or progressive pain  Eulas Post MD [facility] at [facility]

## 2018-05-20 NOTE — Patient Instructions (Signed)
Follow up for any weakness, progressive pain, rash, or other concerns.    Be sure to take the prednisone with food.

## 2018-05-23 ENCOUNTER — Encounter: Payer: Self-pay | Admitting: Family Medicine

## 2018-05-25 ENCOUNTER — Encounter: Payer: Self-pay | Admitting: Family Medicine

## 2018-05-25 ENCOUNTER — Telehealth: Payer: Self-pay | Admitting: Family Medicine

## 2018-05-25 DIAGNOSIS — M79652 Pain in left thigh: Secondary | ICD-10-CM

## 2018-05-25 NOTE — Telephone Encounter (Signed)
Spoke with pt.  Progressive LLE pain and weakness.  Pain severe at times and no relief with prednisone.  We are in process of trying to get MRI set up of lumbar spine.

## 2018-05-26 ENCOUNTER — Ambulatory Visit
Admission: RE | Admit: 2018-05-26 | Discharge: 2018-05-26 | Disposition: A | Discharge status: Home / Self Care | Payer: 59 | Source: Ambulatory Visit | Attending: Family Medicine | Admitting: Family Medicine

## 2018-05-26 ENCOUNTER — Encounter: Payer: Self-pay | Admitting: Family Medicine

## 2018-05-26 DIAGNOSIS — M79652 Pain in left thigh: Secondary | ICD-10-CM

## 2018-05-26 MED ORDER — LORAZEPAM 0.5 MG PO TABS: ORAL_TABLET | ORAL | 0 refills | Status: DC

## 2018-05-26 MED FILL — LORazepam 0.5 MG TABS: 0.5 | 1 days supply | Qty: 1 | Fill #0

## 2018-05-27 ENCOUNTER — Encounter: Payer: Self-pay | Admitting: Family Medicine

## 2018-05-27 ENCOUNTER — Telehealth: Payer: Self-pay | Admitting: Family Medicine

## 2018-05-27 MED ORDER — OXYCODONE-ACETAMINOPHEN 10-325 MG PO TABS: 1.0000 | ORAL_TABLET | Freq: Four times a day (QID) | ORAL | 0 refills | Status: DC | PRN

## 2018-05-27 MED FILL — OXYCODONE-APAP 10-325: 10-325 | 5 days supply | Qty: 20 | Fill #0

## 2018-05-27 NOTE — Telephone Encounter (Signed)
Spoke with patient. He has appointment to see Dr. Phylliss Bob on June 17. He is requesting something for pain over the weekend if he has any recurrence of severe pain. He has done fairly well through today but has had intermittent bouts with severe lower extremity pain. Percocet 10/325 mgs 1 every 6 hours as needed for severe pain #20 with no refill

## 2018-09-13 ENCOUNTER — Other Ambulatory Visit: Payer: Self-pay | Admitting: Family Medicine

## 2018-10-11 ENCOUNTER — Ambulatory Visit (INDEPENDENT_AMBULATORY_CARE_PROVIDER_SITE_OTHER): Payer: 59 | Admitting: *Deleted

## 2018-10-11 DIAGNOSIS — Z23 Encounter for immunization: Secondary | ICD-10-CM | POA: Diagnosis not present

## 2019-01-31 LAB — BASIC METABOLIC PANEL
BUN: 9 (ref 4–21)
Creatinine: 1 (ref 0.6–1.3)
Glucose: 79
Potassium: 5.1 (ref 3.4–5.3)
Sodium: 140 (ref 137–147)

## 2019-01-31 LAB — HEPATIC FUNCTION PANEL
ALT: 15 (ref 10–40)
AST: 19 (ref 14–40)
Alkaline Phosphatase: 56 (ref 25–125)
Bilirubin, Total: 0.8

## 2019-01-31 LAB — LIPID PANEL
Cholesterol: 156 (ref 0–200)
HDL: 59 (ref 35–70)
LDL Cholesterol: 81
Triglycerides: 78 (ref 40–160)

## 2019-01-31 LAB — CBC AND DIFFERENTIAL
HCT: 41 (ref 41–53)
Hemoglobin: 14.5 (ref 13.5–17.5)
Platelets: 229 (ref 150–399)
WBC: 4.7

## 2019-02-13 ENCOUNTER — Ambulatory Visit (INDEPENDENT_AMBULATORY_CARE_PROVIDER_SITE_OTHER): Payer: 59 | Admitting: Family Medicine

## 2019-02-13 ENCOUNTER — Other Ambulatory Visit: Payer: Self-pay

## 2019-02-13 ENCOUNTER — Encounter: Payer: Self-pay | Admitting: Family Medicine

## 2019-02-13 VITALS — BP 104/68 | HR 65 | Temp 97.8°F | Ht 71.2 in | Wt 210.7 lb

## 2019-02-13 DIAGNOSIS — E538 Deficiency of other specified B group vitamins: Secondary | ICD-10-CM

## 2019-02-13 DIAGNOSIS — E559 Vitamin D deficiency, unspecified: Secondary | ICD-10-CM

## 2019-02-13 DIAGNOSIS — K5090 Crohn's disease, unspecified, without complications: Secondary | ICD-10-CM

## 2019-02-13 DIAGNOSIS — E039 Hypothyroidism, unspecified: Secondary | ICD-10-CM

## 2019-02-13 DIAGNOSIS — I10 Essential (primary) hypertension: Secondary | ICD-10-CM | POA: Diagnosis not present

## 2019-02-13 NOTE — Progress Notes (Signed)
Subjective:     Patient ID: Jesus Mccann, male   DOB: Jan 27, 1961, 58 y.o.   MRN: [id number]  HPI Patient is here for medical follow-up.  His chronic problems include history of hypertension, B12 deficiency, vitamin D deficiency, hypothyroidism, regional enteritis.  He needs refills of several medications including valsartan, levothyroxine, and Imuran.  He brings in copy of some labs that were done through work recently which included basic chemistries, CBC, lipid panel and these were all normal.  Liver functions were normal.  Unfortunately, there was no TSH.  He also has history of fairly severe B12 deficiency with no recent levels.  He is taking over-the-counter B12 replacement but no vitamin D replacement  Generally feels well overall.  Compliant with medications.  Appetite and weight stable.  No abdominal pain.  No change in stool habits.  Last colonoscopy was a year ago  Past Medical History:  Diagnosis Date  . CROHN'S DISEASE 01/30/2010  . History of kidney stones   . HYPERTENSION 01/30/2010  . HYPOTHYROIDISM 01/30/2010   Past Surgical History:  Procedure Laterality Date  . BOWEL RESECTION  20 yrs ago   resection x2=hx crohns.  Marland Kitchen EXTRACORPOREAL SHOCK WAVE LITHOTRIPSY Left 02/03/2018   Procedure: LEFT EXTRACORPOREAL SHOCK WAVE LITHOTRIPSY (ESWL);  Surgeon: Cleon Gustin, MD;  Location: [facility];  Service: Urology;  Laterality: Left;  . LAPAROSCOPIC LYSIS INTESTINAL ADHESIONS      reports that he has quit smoking. His smoking use included cigarettes. He has a 3.00 pack-year smoking history. He has never used smokeless tobacco. He reports current alcohol use. He reports that he does not use drugs. family history includes COPD in his father; Cancer in his mother; Diabetes in his father. Allergies  Allergen Reactions  . Losartan Other (See Comments)    weakness     Review of Systems  Constitutional: Negative for fatigue.  Eyes: Negative for visual disturbance.  Respiratory:  Negative for cough, chest tightness and shortness of breath.   Cardiovascular: Negative for chest pain, palpitations and leg swelling.  Gastrointestinal: Negative for abdominal pain, blood in stool and diarrhea.  Neurological: Negative for dizziness, syncope, weakness, light-headedness and headaches.       Objective:   Physical Exam Constitutional:      Appearance: He is well-developed.  HENT:     Right Ear: External ear normal.     Left Ear: External ear normal.  Eyes:     Pupils: Pupils are equal, round, and reactive to light.  Neck:     Musculoskeletal: Neck supple.     Thyroid: No thyromegaly.  Cardiovascular:     Rate and Rhythm: Normal rate and regular rhythm.  Pulmonary:     Effort: Pulmonary effort is normal. No respiratory distress.     Breath sounds: Normal breath sounds. No wheezing or rales.  Neurological:     Mental Status: He is alert and oriented to person, place, and time.        Assessment:     #1 hypothyroidism  #2 hypertension stable and at goal  #3 history of regional enteritis symptomatically stable.  #4 history of B12 deficiency  #5 history of vitamin D deficiency    Plan:     -Check labs with TSH, B12 level, 25 hydroxy vitamin D.  We did not obtain chemistries or lipids since he has these done recently through work and these were reviewed today.  -He needs refills of valsartan, levothyroxine, and Imuran but will wait on lab work with his  thyroid functions first  Eulas Post MD [facility] at [facility]

## 2019-02-14 LAB — VITAMIN D 25 HYDROXY (VIT D DEFICIENCY, FRACTURES): VITD: 20.11 ng/mL — ABNORMAL LOW (ref 30.00–100.00)

## 2019-02-14 LAB — TSH: TSH: 1.03 u[IU]/mL (ref 0.35–4.50)

## 2019-02-14 LAB — VITAMIN B12: Vitamin B-12: 233 pg/mL (ref 211–911)

## 2019-02-17 ENCOUNTER — Other Ambulatory Visit: Payer: Self-pay | Admitting: Family Medicine

## 2019-02-20 ENCOUNTER — Encounter: Payer: Self-pay | Admitting: Family Medicine

## 2019-02-20 ENCOUNTER — Other Ambulatory Visit: Payer: Self-pay

## 2019-02-20 MED ORDER — LEVOTHYROXINE SODIUM 50 MCG PO TABS: 50.0000 ug | ORAL_TABLET | Freq: Every day | ORAL | 3 refills | Status: DC

## 2019-02-26 ENCOUNTER — Other Ambulatory Visit: Payer: Self-pay | Admitting: Family Medicine

## 2019-02-26 MED ORDER — LEVOTHYROXINE SODIUM 50 MCG PO TABS: 50.0000 ug | ORAL_TABLET | Freq: Every day | ORAL | 3 refills | Status: DC

## 2019-02-26 MED ORDER — AZATHIOPRINE 50 MG PO TABS: 50.0000 mg | ORAL_TABLET | Freq: Two times a day (BID) | ORAL | 3 refills | Status: DC

## 2019-02-26 MED ORDER — VALSARTAN 160 MG PO TABS: ORAL_TABLET | ORAL | 3 refills | Status: DC

## 2019-02-26 NOTE — Addendum Note (Signed)
Addended by: Eulas Post on: 02/26/2019 01:55 PM   Modules accepted: Orders

## 2019-03-11 IMAGING — CT CT RENAL STONE PROTOCOL
2 of 4 series · 16 of 46 positions shown, 18 images · non-contrast
Comparison: None.

CLINICAL DATA: 56-year-old male with acute left flank and abdominal
pain.

EXAM:
CT ABDOMEN AND PELVIS WITHOUT CONTRAST
TECHNIQUE: Multidetector CT imaging of the abdomen and pelvis was performed
following the standard protocol without IV contrast.

[Series 2: axial st · axial · 0.75mm/px · z∈[-392,+28]mm · 13 of 98 slices shown, 15 images]
[im 7/98  soft-tissue]
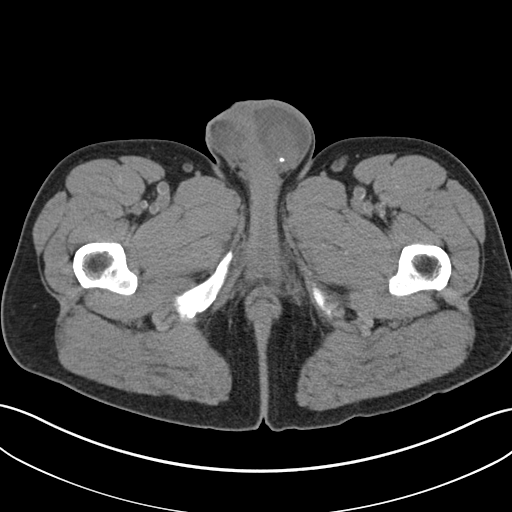
[im 7/98  bone]
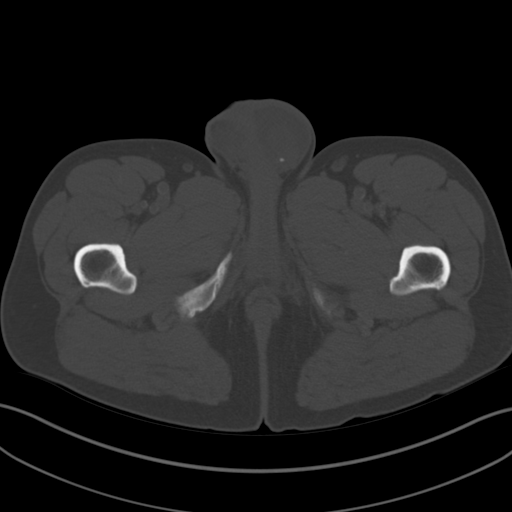
[im 13/98  soft-tissue]
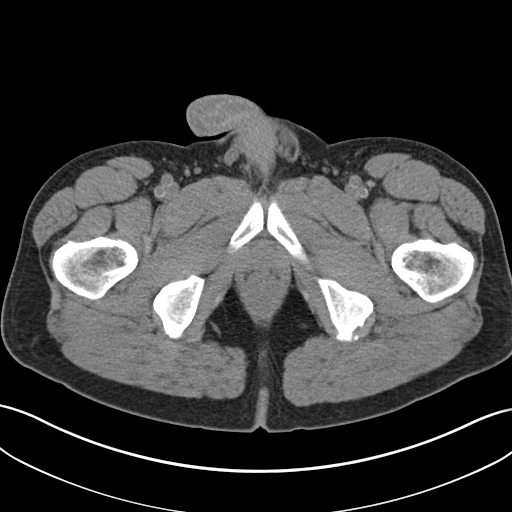
[im 20/98  soft-tissue]
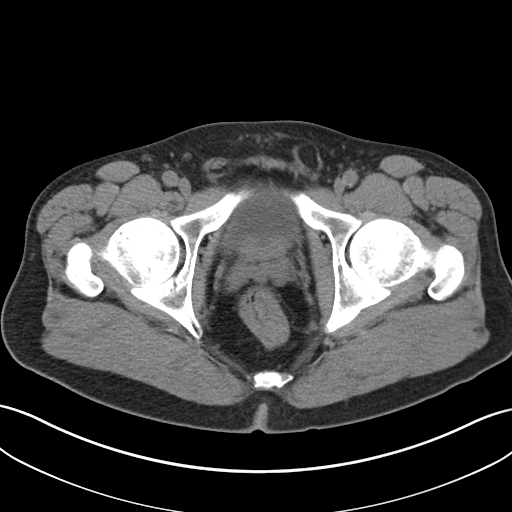
[im 26/98  soft-tissue]
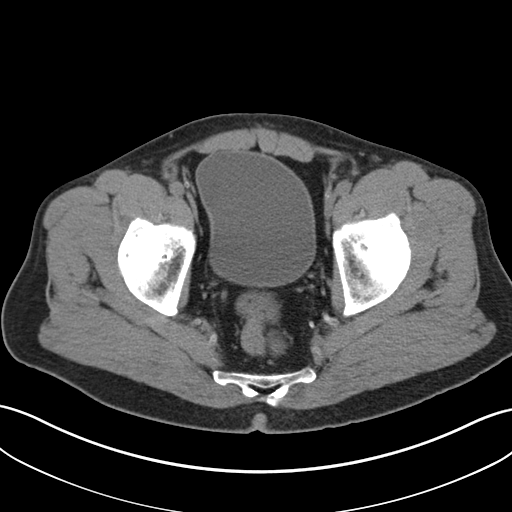
[im 33/98  soft-tissue]
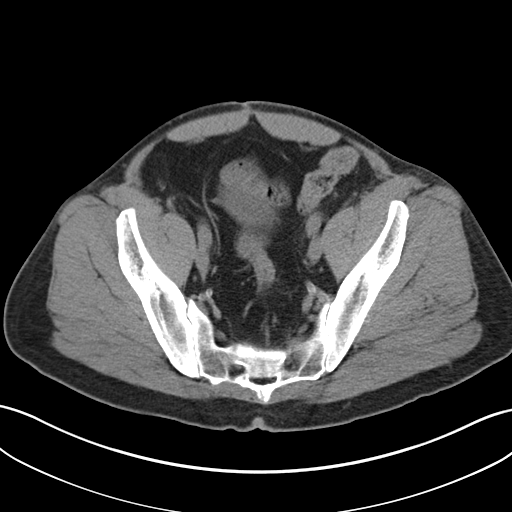
[im 39/98  soft-tissue]
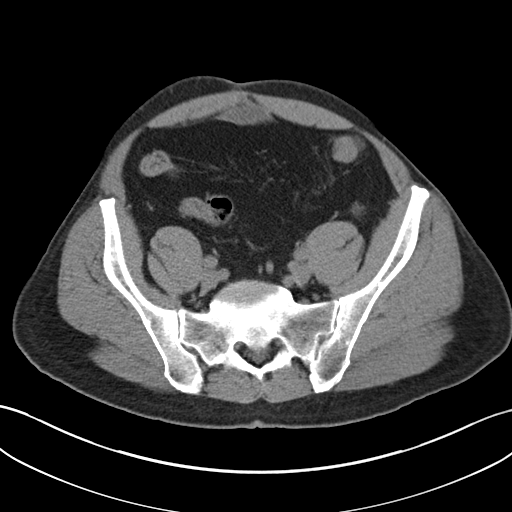
[im 52/98  soft-tissue]
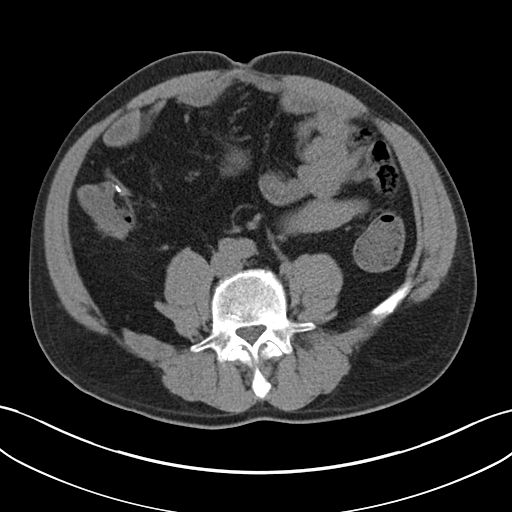
[im 59/98  soft-tissue]
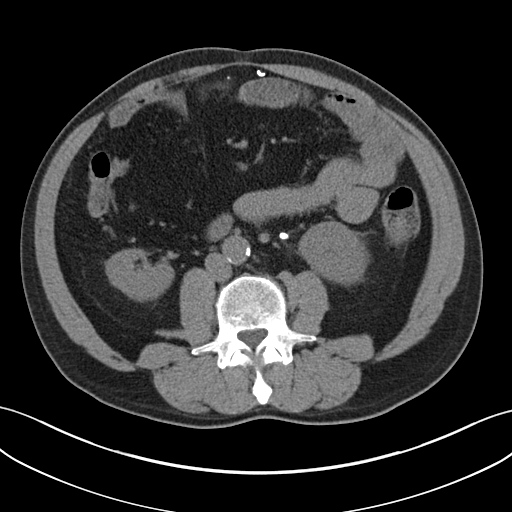
[im 65/98  soft-tissue]
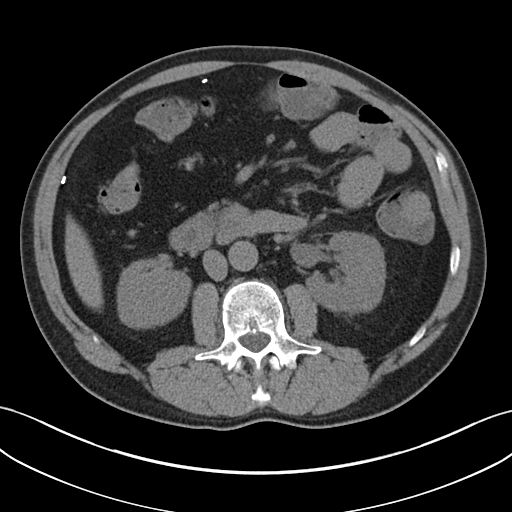
[im 65/98  bone]
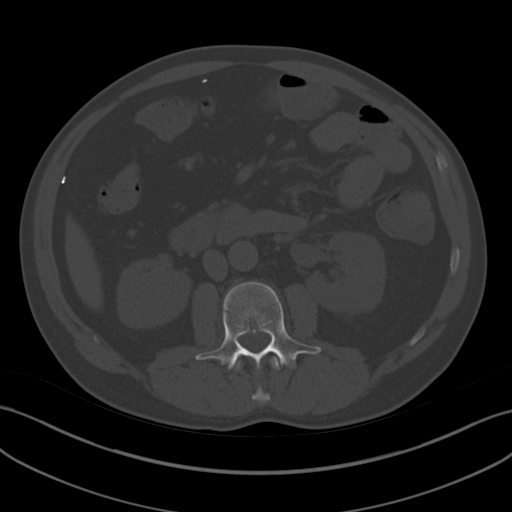
[im 72/98  soft-tissue]
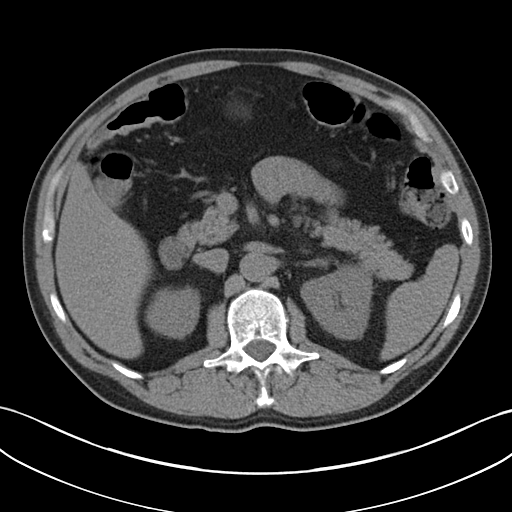
[im 78/98  soft-tissue]
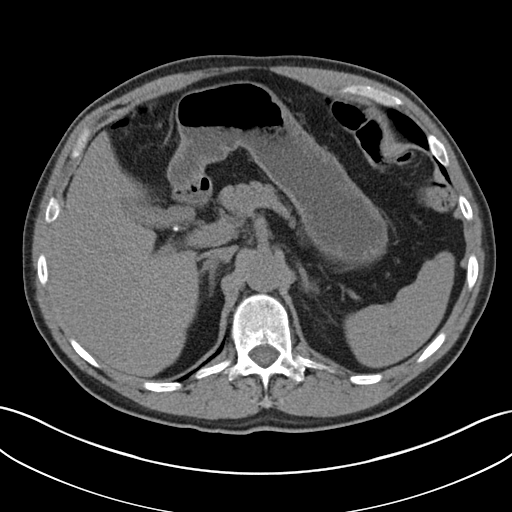
[im 85/98  soft-tissue]
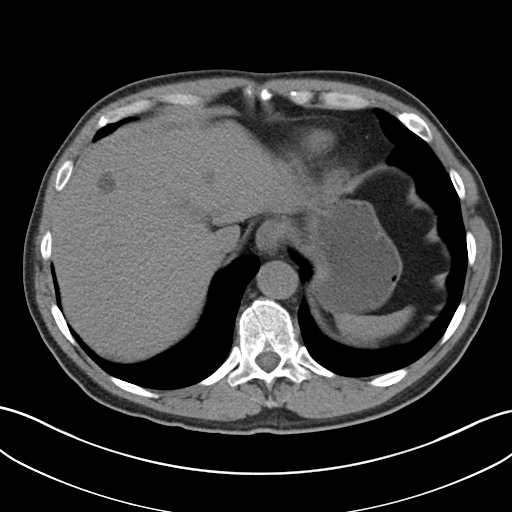
[im 91/98  soft-tissue]
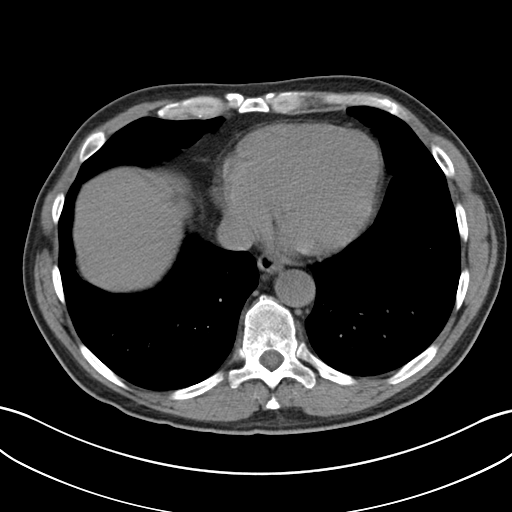

[Series 5: coronal · coronal · 0.73mm/px · 3 of 151 slices shown]
[im 51/151  soft-tissue]
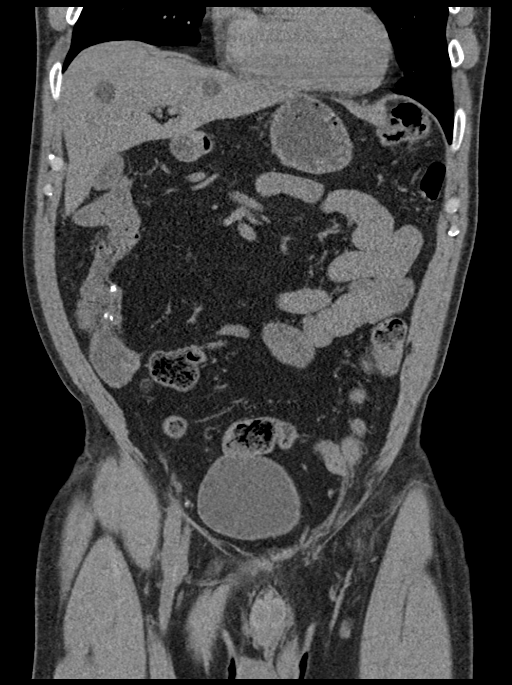
[im 67/151  soft-tissue]
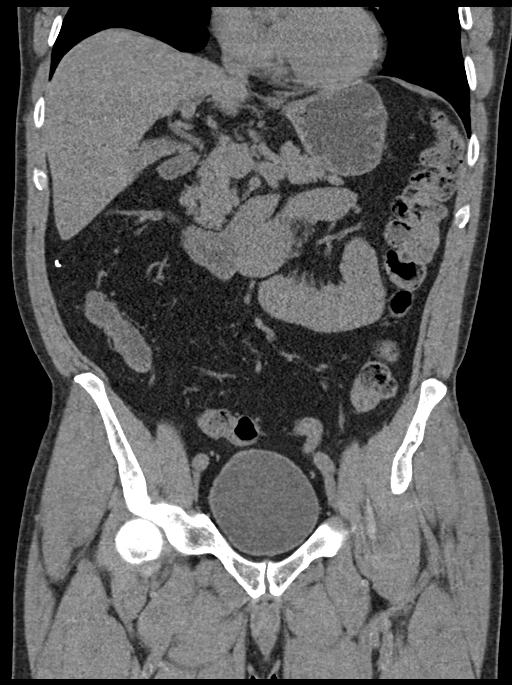
[im 84/151  soft-tissue]
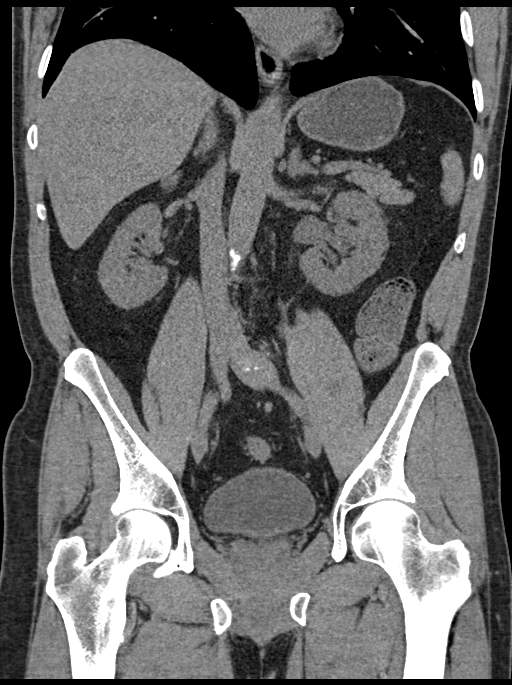

[16 of 46 positions shown; findings below may reference images not displayed]

FINDINGS: Please note that parenchymal abnormalities may be missed without
intravenous contrast.

Lower chest: No acute abnormality

Hepatobiliary: Hepatic cysts are present without other hepatic
abnormality. Gallbladder is unremarkable. No biliary dilatation.

Pancreas: Unremarkable

Spleen: Unremarkable

Adrenals/Urinary Tract: A 8 mm left UPJ calculus causes mild left
hydronephrosis. A 6 mm nonobstructing mid left renal calculus is
present.

The right kidney, adrenal glands and bladder are unremarkable.

Stomach/Bowel: No bowel obstruction, bowel wall thickening or
inflammatory changes. Bowel surgical changes are present.

Vascular/Lymphatic: Aortic atherosclerosis. No enlarged abdominal or
pelvic lymph nodes.

Reproductive: Prostate is unremarkable.

Other: No ascites, focal collection or pneumoperitoneum.

Musculoskeletal: No acute or significant osseous findings.
IMPRESSION: 1. 8 mm left UPJ calculus causing mild left hydronephrosis.
2. Nonobstructing 6 mm mid left renal calculus
3.  Aortic Atherosclerosis ([id number]).

## 2019-03-30 ENCOUNTER — Encounter: Payer: Self-pay | Admitting: Family Medicine

## 2020-02-15 ENCOUNTER — Other Ambulatory Visit: Payer: Self-pay

## 2020-02-16 ENCOUNTER — Encounter: Payer: 59 | Admitting: Family Medicine

## 2020-02-16 ENCOUNTER — Ambulatory Visit (INDEPENDENT_AMBULATORY_CARE_PROVIDER_SITE_OTHER): Payer: 59 | Admitting: Family Medicine

## 2020-02-16 VITALS — BP 110/70 | HR 61 | Temp 97.9°F | Ht 71.0 in | Wt 219.3 lb

## 2020-02-16 DIAGNOSIS — Z23 Encounter for immunization: Secondary | ICD-10-CM

## 2020-02-16 DIAGNOSIS — E039 Hypothyroidism, unspecified: Secondary | ICD-10-CM | POA: Diagnosis not present

## 2020-02-16 DIAGNOSIS — E538 Deficiency of other specified B group vitamins: Secondary | ICD-10-CM

## 2020-02-16 DIAGNOSIS — Z125 Encounter for screening for malignant neoplasm of prostate: Secondary | ICD-10-CM

## 2020-02-16 DIAGNOSIS — E559 Vitamin D deficiency, unspecified: Secondary | ICD-10-CM

## 2020-02-16 DIAGNOSIS — Z0000 Encounter for general adult medical examination without abnormal findings: Secondary | ICD-10-CM | POA: Diagnosis not present

## 2020-02-16 LAB — PSA: PSA: 0.69 ng/mL (ref 0.10–4.00)

## 2020-02-16 LAB — VITAMIN D 25 HYDROXY (VIT D DEFICIENCY, FRACTURES): VITD: 31.71 ng/mL (ref 30.00–100.00)

## 2020-02-16 LAB — VITAMIN B12: Vitamin B-12: 191 pg/mL — ABNORMAL LOW (ref 211–911)

## 2020-02-16 LAB — TSH: TSH: 1.05 u[IU]/mL (ref 0.35–4.50)

## 2020-02-16 MED ORDER — LEVOTHYROXINE SODIUM 50 MCG PO TABS: 50.0000 ug | ORAL_TABLET | Freq: Every day | ORAL | 3 refills | Status: DC

## 2020-02-16 MED ORDER — AZATHIOPRINE 50 MG PO TABS: 50.0000 mg | ORAL_TABLET | Freq: Two times a day (BID) | ORAL | 3 refills | Status: DC

## 2020-02-16 MED ORDER — VALSARTAN 160 MG PO TABS: ORAL_TABLET | ORAL | 3 refills | Status: DC

## 2020-02-16 NOTE — Progress Notes (Signed)
Subjective:     Patient ID: Jesus Mccann, male   DOB: 07-28-1961, 59 y.o.   MRN: [id number]  HPI   Jesus Mccann is seen for physical exam.  His chronic problems include hypothyroidism, hypertension, Crohn's disease.  He had recent lab work from his work and this included CBC, comprehensive metabolic panel, and lipid panel.  These were reviewed and labs all look good with no concerns.  He has hypothyroidism and is on replacement.  He is due for follow-up labs there.  He has had history of B12 and vitamin D deficiency.  He takes supplements but somewhat irregularly.  No history of hepatitis C screening.  Needs Tdap.  No history of shingles vaccine.  He has had previous Pneumovax because of his Crohn's disease.  He is maintained on Imuran  He will be due for repeat colonoscopy next year  Past Medical History:  Diagnosis Date  . CROHN'S DISEASE 01/30/2010  . History of kidney stones   . HYPERTENSION 01/30/2010  . HYPOTHYROIDISM 01/30/2010   Past Surgical History:  Procedure Laterality Date  . BOWEL RESECTION  20 yrs ago   resection x2=hx crohns.  Marland Kitchen EXTRACORPOREAL SHOCK WAVE LITHOTRIPSY Left 02/03/2018   Procedure: LEFT EXTRACORPOREAL SHOCK WAVE LITHOTRIPSY (ESWL);  Surgeon: Cleon Gustin, MD;  Location: [facility];  Service: Urology;  Laterality: Left;  . LAPAROSCOPIC LYSIS INTESTINAL ADHESIONS      reports that he has quit smoking. His smoking use included cigarettes. He has a 3.00 pack-year smoking history. He has never used smokeless tobacco. He reports current alcohol use. He reports that he does not use drugs. family history includes COPD in his father; Cancer in his mother; Diabetes in his father. Allergies  Allergen Reactions  . Losartan Other (See Comments)    weakness     Review of Systems  Constitutional: Negative for activity change, appetite change, fatigue and fever.  HENT: Negative for congestion, ear pain and trouble swallowing.   Eyes: Negative for pain and visual  disturbance.  Respiratory: Negative for cough, shortness of breath and wheezing.   Cardiovascular: Negative for chest pain and palpitations.  Gastrointestinal: Negative for abdominal distention, abdominal pain, blood in stool, constipation, diarrhea, nausea, rectal pain and vomiting.  Genitourinary: Negative for dysuria, hematuria and testicular pain.  Musculoskeletal: Negative for arthralgias and joint swelling.  Skin: Negative for rash.  Neurological: Negative for dizziness, syncope and headaches.  Hematological: Negative for adenopathy.  Psychiatric/Behavioral: Negative for confusion and dysphoric mood.       Objective:   Physical Exam Constitutional:      General: He is not in acute distress.    Appearance: He is well-developed.  HENT:     Head: Normocephalic and atraumatic.     Right Ear: External ear normal.     Left Ear: External ear normal.  Eyes:     Conjunctiva/sclera: Conjunctivae normal.     Pupils: Pupils are equal, round, and reactive to light.  Neck:     Thyroid: No thyromegaly.  Cardiovascular:     Rate and Rhythm: Normal rate and regular rhythm.     Heart sounds: Normal heart sounds. No murmur.  Pulmonary:     Effort: No respiratory distress.     Breath sounds: No wheezing or rales.  Abdominal:     General: Bowel sounds are normal. There is no distension.     Palpations: Abdomen is soft. There is no mass.     Tenderness: There is no abdominal tenderness. There is no guarding  or rebound.  Musculoskeletal:     Cervical back: Normal range of motion and neck supple.     Right lower leg: No edema.     Left lower leg: No edema.  Lymphadenopathy:     Cervical: No cervical adenopathy.  Skin:    Findings: No rash.  Neurological:     Mental Status: He is alert and oriented to person, place, and time.     Cranial Nerves: No cranial nerve deficit.     Deep Tendon Reflexes: Reflexes normal.        Assessment:     Physical exam-we discussed several health  maintenance issues as below.  Hypertension stable and Crohn's stable symptomatically.      Plan:     -Tetanus booster given -We recommend further labs with TSH, hepatitis C antibody, PSA, B12, 25 hydroxy vitamin D level.  Did not obtain CBC, CMP, or lipids as these were done through work and reviewed.   -Discussed Shingrix vaccine he will check on insurance coverage -He has declined flu vaccination -Refills of his regular medications given for 1 year  Eulas Post MD  Primary Care at [facility]

## 2020-02-16 NOTE — Patient Instructions (Signed)
Preventive Care 41-61 Years Old, Male Preventive care refers to lifestyle choices and visits with your health care provider that can promote health and wellness. This includes:  A yearly physical exam. This is also called an annual well check.  Regular dental and eye exams.  Immunizations.  Screening for certain conditions.  Healthy lifestyle choices, such as eating a healthy diet, getting regular exercise, not using drugs or products that contain nicotine and tobacco, and limiting alcohol use. What can I expect for my preventive care visit? Physical exam Your health care provider will check:  Height and weight. These may be used to calculate body mass index (BMI), which is a measurement that tells if you are at a healthy weight.  Heart rate and blood pressure.  Your skin for abnormal spots. Counseling Your health care provider may ask you questions about:  Alcohol, tobacco, and drug use.  Emotional well-being.  Home and relationship well-being.  Sexual activity.  Eating habits.  Work and work Statistician. What immunizations do I need?  Influenza (flu) vaccine  This is recommended every year. Tetanus, diphtheria, and pertussis (Tdap) vaccine  You may need a Td booster every 10 years. Varicella (chickenpox) vaccine  You may need this vaccine if you have not already been vaccinated. Zoster (shingles) vaccine  You may need this after age 64. Measles, mumps, and rubella (MMR) vaccine  You may need at least one dose of MMR if you were born in 1957 or later. You may also need a second dose. Pneumococcal conjugate (PCV13) vaccine  You may need this if you have certain conditions and were not previously vaccinated. Pneumococcal polysaccharide (PPSV23) vaccine  You may need one or two doses if you smoke cigarettes or if you have certain conditions. Meningococcal conjugate (MenACWY) vaccine  You may need this if you have certain conditions. Hepatitis A  vaccine  You may need this if you have certain conditions or if you travel or work in places where you may be exposed to hepatitis A. Hepatitis B vaccine  You may need this if you have certain conditions or if you travel or work in places where you may be exposed to hepatitis B. Haemophilus influenzae type b (Hib) vaccine  You may need this if you have certain risk factors. Human papillomavirus (HPV) vaccine  If recommended by your health care provider, you may need three doses over 6 months. You may receive vaccines as individual doses or as more than one vaccine together in one shot (combination vaccines). Talk with your health care provider about the risks and benefits of combination vaccines. What tests do I need? Blood tests  Lipid and cholesterol levels. These may be checked every 5 years, or more frequently if you are over 60 years old.  Hepatitis C test.  Hepatitis B test. Screening  Lung cancer screening. You may have this screening every year starting at age 43 if you have a 30-pack-year history of smoking and currently smoke or have quit within the past 15 years.  Prostate cancer screening. Recommendations will vary depending on your family history and other risks.  Colorectal cancer screening. All adults should have this screening starting at age 72 and continuing until age 2. Your health care provider may recommend screening at age 14 if you are at increased risk. You will have tests every 1-10 years, depending on your results and the type of screening test.  Diabetes screening. This is done by checking your blood sugar (glucose) after you have not eaten  for a while (fasting). You may have this done every 1-3 years.  Sexually transmitted disease (STD) testing. Follow these instructions at home: Eating and drinking  Eat a diet that includes fresh fruits and vegetables, whole grains, lean protein, and low-fat dairy products.  Take vitamin and mineral supplements as  recommended by your health care provider.  Do not drink alcohol if your health care provider tells you not to drink.  If you drink alcohol: ? Limit how much you have to 0-2 drinks a day. ? Be aware of how much alcohol is in your drink. In the [place] one drink equals one 12 oz bottle of beer (355 mL), one 5 oz glass of wine (148 mL), or one 1 oz glass of hard liquor (44 mL). Lifestyle  Take daily care of your teeth and gums.  Stay active. Exercise for at least 30 minutes on 5 or more days each week.  Do not use any products that contain nicotine or tobacco, such as cigarettes, e-cigarettes, and chewing tobacco. If you need help quitting, ask your health care provider.  If you are sexually active, practice safe sex. Use a condom or other form of protection to prevent STIs (sexually transmitted infections).  Talk with your health care provider about taking a low-dose aspirin every day starting at age 52. What's next?  Go to your health care provider once a year for a well check visit.  Ask your health care provider how often you should have your eyes and teeth checked.  Stay up to date on all vaccines. This information is not intended to replace advice given to you by your health care provider. Make sure you discuss any questions you have with your health care provider. Document Revised: 12/01/2018 Document Reviewed: 12/01/2018 Elsevier Patient Education  [place].  Consider shingles vaccine (Shingrix) and check with insurance if interested

## 2020-02-19 ENCOUNTER — Encounter: Payer: Self-pay | Admitting: Family Medicine

## 2020-02-19 LAB — HEPATITIS C ANTIBODY
Hepatitis C Ab: NONREACTIVE
SIGNAL TO CUT-OFF: 0.01 (ref <1.00)

## 2020-02-19 MED ORDER — "BD ECLIPSE SYRINGE 25G X 5/8"" 3 ML MISC": 0 refills | Status: DC

## 2020-02-19 MED ORDER — CYANOCOBALAMIN 1000 MCG/ML IJ SOLN: 1000.0000 ug | INTRAMUSCULAR | 3 refills | Status: DC

## 2020-02-19 NOTE — Addendum Note (Signed)
Addended by: Agnes Lawrence on: 02/19/2020 10:40 AM   Modules accepted: Orders

## 2020-02-23 ENCOUNTER — Encounter: Payer: Self-pay | Admitting: Family Medicine

## 2020-02-26 ENCOUNTER — Other Ambulatory Visit: Payer: Self-pay | Admitting: Family Medicine

## 2020-02-26 MED FILL — BD 3 ML SYRINGE 25GX1: 25G X 1" | 28 days supply | Qty: 4 | Fill #0

## 2020-02-26 MED FILL — CYANOCOBALAMIN 1,000 MCG/ML: 1000 | 28 days supply | Qty: 4 | Fill #0

## 2020-02-26 MED FILL — BD 3 ML SYRINGE 25GX1": 25G X 1" | 28 days supply | Qty: 4 | Fill #0

## 2020-02-27 ENCOUNTER — Encounter: Payer: Self-pay | Admitting: Family Medicine

## 2020-05-22 ENCOUNTER — Other Ambulatory Visit: Payer: Self-pay

## 2020-05-22 ENCOUNTER — Other Ambulatory Visit (INDEPENDENT_AMBULATORY_CARE_PROVIDER_SITE_OTHER): Payer: 59

## 2020-05-22 DIAGNOSIS — E538 Deficiency of other specified B group vitamins: Secondary | ICD-10-CM

## 2020-05-22 LAB — VITAMIN B12: Vitamin B-12: 162 pg/mL — ABNORMAL LOW (ref 211–911)

## 2020-05-24 ENCOUNTER — Other Ambulatory Visit: Payer: Self-pay

## 2020-05-24 DIAGNOSIS — E538 Deficiency of other specified B group vitamins: Secondary | ICD-10-CM

## 2020-05-24 MED ORDER — CYANOCOBALAMIN 1000 MCG/ML IJ SOLN
1000.0000 ug | INTRAMUSCULAR | 7 refills | Status: DC
Start: 2020-05-24 — End: 2020-08-06

## 2020-05-24 MED FILL — CYANOCOBALAMIN 1,000 MCG/ML: 1000 | 7 days supply | Qty: 1 | Fill #0

## 2020-05-27 ENCOUNTER — Other Ambulatory Visit: Payer: Self-pay | Admitting: Family Medicine

## 2020-07-03 ENCOUNTER — Telehealth: Payer: Self-pay | Admitting: Family Medicine

## 2020-07-03 MED FILL — BD 3 ML SYRINGE 25GX1: 25G X 1" | 13 days supply | Qty: 2 | Fill #0

## 2020-07-03 MED FILL — CYANOCOBALAMIN 1,000 MCG/ML: 1000 | 7 days supply | Qty: 1 | Fill #1

## 2020-07-03 NOTE — Telephone Encounter (Signed)
Called pharmacy and told them last prescription on June 4th he is suppose to continue until he completes the 8 weeks

## 2020-07-03 NOTE — Telephone Encounter (Signed)
Pharmacist is wondering if the pt needs to be taking the over the counter B12 now or still the injectables?  [place], [place] Phone:  [phone]  Fax:  [phone]

## 2020-07-09 MED FILL — BD 3 ML SYRINGE 25GX1: 25G X 1" | 13 days supply | Qty: 2 | Fill #1

## 2020-07-09 MED FILL — CYANOCOBALAMIN 1,000 MCG/ML: 1000 | 7 days supply | Qty: 1 | Fill #2

## 2020-07-12 ENCOUNTER — Encounter: Payer: Self-pay | Admitting: Family Medicine

## 2020-07-19 MED FILL — CYANOCOBALAMIN 1,000 MCG/ML: 1000 | 7 days supply | Qty: 1 | Fill #3

## 2020-07-23 ENCOUNTER — Encounter: Payer: Self-pay | Admitting: Family Medicine

## 2020-07-25 NOTE — Addendum Note (Signed)
Addended by: Marrion Coy on: 07/25/2020 04:09 PM   Modules accepted: Orders

## 2020-07-31 ENCOUNTER — Other Ambulatory Visit (INDEPENDENT_AMBULATORY_CARE_PROVIDER_SITE_OTHER): Payer: 59

## 2020-07-31 ENCOUNTER — Other Ambulatory Visit: Payer: Self-pay

## 2020-07-31 DIAGNOSIS — E538 Deficiency of other specified B group vitamins: Secondary | ICD-10-CM

## 2020-07-31 LAB — VITAMIN B12: Vitamin B-12: 559 pg/mL (ref 200–1100)

## 2020-08-04 ENCOUNTER — Encounter: Payer: Self-pay | Admitting: Family Medicine

## 2020-08-06 ENCOUNTER — Other Ambulatory Visit: Payer: Self-pay | Admitting: Family Medicine

## 2020-08-06 MED ORDER — CYANOCOBALAMIN 1000 MCG/ML IJ SOLN: 1000.0000 ug | INTRAMUSCULAR | 7 refills | Status: DC

## 2020-08-06 MED ORDER — "BD LUER-LOK SYRINGE 25G X 1"" 3 ML MISC": 2 refills | Status: DC

## 2020-08-06 MED FILL — BD 3 ML SYRINGE 25GX1: 25G X 1" | 4 days supply | Qty: 4 | Fill #0

## 2020-08-06 MED FILL — CYANOCOBALAMIN 1,000 MCG/ML: 1000 | 30 days supply | Qty: 1 | Fill #0

## 2020-08-10 ENCOUNTER — Encounter: Payer: Self-pay | Admitting: Family Medicine

## 2020-09-02 MED FILL — CYANOCOBALAMIN 1,000 MCG/ML: 1000 | 30 days supply | Qty: 1 | Fill #1

## 2020-10-07 ENCOUNTER — Encounter: Payer: Self-pay | Admitting: Family Medicine

## 2020-10-14 MED FILL — CYANOCOBALAMIN 1,000 MCG/ML: 1000 | 30 days supply | Qty: 1 | Fill #2

## 2020-10-21 ENCOUNTER — Encounter: Payer: Self-pay | Admitting: Family Medicine

## 2020-10-21 ENCOUNTER — Ambulatory Visit (INDEPENDENT_AMBULATORY_CARE_PROVIDER_SITE_OTHER): Payer: 59 | Admitting: Family Medicine

## 2020-10-21 ENCOUNTER — Other Ambulatory Visit: Payer: Self-pay

## 2020-10-21 ENCOUNTER — Other Ambulatory Visit: Payer: Self-pay | Admitting: Family Medicine

## 2020-10-21 VITALS — BP 118/76 | HR 60 | Temp 97.9°F | Ht 72.0 in | Wt 208.6 lb

## 2020-10-21 DIAGNOSIS — M545 Low back pain, unspecified: Secondary | ICD-10-CM

## 2020-10-21 DIAGNOSIS — K219 Gastro-esophageal reflux disease without esophagitis: Secondary | ICD-10-CM | POA: Diagnosis not present

## 2020-10-21 DIAGNOSIS — G8929 Other chronic pain: Secondary | ICD-10-CM

## 2020-10-21 DIAGNOSIS — E538 Deficiency of other specified B group vitamins: Secondary | ICD-10-CM | POA: Diagnosis not present

## 2020-10-21 MED ORDER — CYCLOBENZAPRINE HCL 10 MG PO TABS: 10.0000 mg | ORAL_TABLET | Freq: Three times a day (TID) | ORAL | 0 refills | Status: DC | PRN

## 2020-10-21 MED ORDER — CYANOCOBALAMIN 1000 MCG/ML IJ SOLN: 1000.0000 ug | INTRAMUSCULAR | 1 refills | Status: DC

## 2020-10-21 MED ORDER — BD LUER-LOK SYRINGE 25G X 1" 3 ML MISC
1 refills | Status: DC
Start: 2020-10-21 — End: 2020-10-21

## 2020-10-21 MED FILL — CYANOCOBALAMIN 1,000 MCG/ML: 1000 | 28 days supply | Qty: 2 | Fill #0

## 2020-10-21 MED FILL — CYCLOBENZAPRINE HCL 10 MG T: 10 | 10 days supply | Qty: 30 | Fill #0

## 2020-10-21 MED FILL — BD 3 ML SYRINGE 25GX1: 25G X 1" | 28 days supply | Qty: 2 | Fill #0

## 2020-10-21 NOTE — Progress Notes (Signed)
Established Patient Office Visit  Subjective:  Patient ID: Jesus Mccann, male    DOB: April 14, 1961  Age: 59 y.o. MRN: [id number]  CC:  Chief Complaint  Patient presents with  . Heartburn    burning in chest, pain in mid chest, ingestion , no nausea, taking otc acid medication     HPI Jesus Mccann presents for several items as follows  Last Monday around 3 PM at work he recalled epigastric burning sensation.  Over the next couple days he had frequent burning sensation moving up toward the sternal notch.  He generally drinks a fair amount of coffee at baseline and he scaled this back.  He had been using a lot of wintergreen lifesavers as well.  Denies any recent exertional chest pain.  He started taking over-the-counter omeprazole 20 mg and after a couple days symptoms improved and have now resolved for the past few days.  No recent melena.  Swallowing function is okay.  No change in appetite.  No weight loss.  Non-smoker.  No regular alcohol use.  He has history of Crohn's disease.  We diagnosed B12 deficiency last year.  Last levels were over 500.  He is getting B12 1000 mcg IM at home every 2 weeks.  Needs refills.  Last prescription was sent in for just 1 mL.  Also needs some syringes.  Intermittent low back pain and stiffness.  Has used Flexeril in the past which seemed to work fairly well.  Requesting refill.  No sciatica symptoms.  Past Medical History:  Diagnosis Date  . CROHN'S DISEASE 01/30/2010  . History of kidney stones   . HYPERTENSION 01/30/2010  . HYPOTHYROIDISM 01/30/2010    Past Surgical History:  Procedure Laterality Date  . BOWEL RESECTION  20 yrs ago   resection x2=hx crohns.  Marland Kitchen EXTRACORPOREAL SHOCK WAVE LITHOTRIPSY Left 02/03/2018   Procedure: LEFT EXTRACORPOREAL SHOCK WAVE LITHOTRIPSY (ESWL);  Surgeon: Cleon Gustin, MD;  Location: [facility];  Service: Urology;  Laterality: Left;  . LAPAROSCOPIC LYSIS INTESTINAL ADHESIONS      Family History  Problem  Relation Age of Onset  . Cancer Mother        breast cancer  . COPD Father   . Diabetes Father   . Colon cancer Neg Hx     Social History   Socioeconomic History  . Marital status: Married    Spouse name: Not on file  . Number of children: 1  . Years of education: Not on file  . Highest education level: Not on file  Occupational History  . Occupation: Fireman  Tobacco Use  . Smoking status: Former Smoker    Packs/day: 0.50    Years: 6.00    Pack years: 3.00    Types: Cigarettes  . Smokeless tobacco: Never Used  Vaping Use  . Vaping Use: Never used  Substance and Sexual Activity  . Alcohol use: Yes    Alcohol/week: 0.0 standard drinks    Comment: occasional beer per pt.  . Drug use: No  . Sexual activity: Not on file  Other Topics Concern  . Not on file  Social History Narrative  . Not on file   Social Determinants of Health   Financial Resource Strain:   . Difficulty of Paying Living Expenses: Not on file  Food Insecurity:   . Worried About Charity fundraiser in the Last Year: Not on file  . Ran Out of Food in the Last Year: Not on file  Transportation Needs:   .  Lack of Transportation (Medical): Not on file  . Lack of Transportation (Non-Medical): Not on file  Physical Activity:   . Days of Exercise per Week: Not on file  . Minutes of Exercise per Session: Not on file  Stress:   . Feeling of Stress : Not on file  Social Connections:   . Frequency of Communication with Friends and Family: Not on file  . Frequency of Social Gatherings with Friends and Family: Not on file  . Attends Religious Services: Not on file  . Active Member of Clubs or Organizations: Not on file  . Attends Archivist Meetings: Not on file  . Marital Status: Not on file  Intimate Partner Violence:   . Fear of Current or Ex-Partner: Not on file  . Emotionally Abused: Not on file  . Physically Abused: Not on file  . Sexually Abused: Not on file    Outpatient Medications  Prior to Visit  Medication Sig Dispense Refill  . azaTHIOprine (IMURAN) 50 MG tablet Take 1 tablet (50 mg total) by mouth 2 (two) times daily. 180 tablet 3  . Cholecalciferol (VITAMIN D3) 50 MCG (2000 UT) CAPS Take by mouth. Once a day    . levothyroxine (SYNTHROID) 50 MCG tablet Take 1 tablet (50 mcg total) by mouth daily. 90 tablet 3  . omeprazole (PRILOSEC) 20 MG capsule Take 20 mg by mouth daily. Once a day    . valsartan (DIOVAN) 160 MG tablet Take one tablet daily 90 tablet 3  . cyanocobalamin (,VITAMIN B-12,) 1000 MCG/ML injection Inject 1 mL (1,000 mcg total) into the muscle every 30 (thirty) days. 1 mL 7  . SYRINGE-NEEDLE, DISP, 3 ML (B-D 3CC LUER-LOK SYR 25GX1") 25G X 1" 3 ML MISC USE AS DIRECTED 4 each 2  . LORazepam (ATIVAN) 0.5 MG tablet Take about 1 hour prior to procedure 1 tablet 0   No facility-administered medications prior to visit.    Allergies  Allergen Reactions  . Losartan Other (See Comments)    weakness    ROS Review of Systems  Constitutional: Negative for appetite change, chills, fever and unexpected weight change.  Respiratory: Negative for shortness of breath.   Cardiovascular: Negative for chest pain.  Gastrointestinal: Negative for abdominal distention, blood in stool, nausea and vomiting.  Musculoskeletal: Positive for back pain.  Neurological: Negative for weakness.      Objective:    Physical Exam Vitals reviewed.  Constitutional:      Appearance: Normal appearance.  Cardiovascular:     Rate and Rhythm: Normal rate and regular rhythm.  Pulmonary:     Effort: Pulmonary effort is normal.     Breath sounds: Normal breath sounds.  Abdominal:     General: Abdomen is flat. Bowel sounds are normal. There is no distension.     Palpations: There is no mass.     Tenderness: There is no guarding or rebound.  Neurological:     Mental Status: He is alert.     BP 118/76 (BP Location: Left Arm, Patient Position: Sitting, Cuff Size: Normal)    Pulse 60   Temp 97.9 F (36.6 C) (Oral)   Ht 6' (1.829 m)   Wt 208 lb 9.6 oz (94.6 kg)   SpO2 98%   BMI 28.29 kg/m  Wt Readings from Last 3 Encounters:  10/21/20 208 lb 9.6 oz (94.6 kg)  02/16/20 219 lb 4.8 oz (99.5 kg)  02/13/19 210 lb 11.2 oz (95.6 kg)     Health Maintenance Due  Topic Date Due  . HIV Screening  Never done    There are no preventive care reminders to display for this patient.  Lab Results  Component Value Date   TSH 1.05 02/16/2020   Lab Results  Component Value Date   WBC 4.7 01/31/2019   HGB 14.5 01/31/2019   HCT 41 01/31/2019   MCV 91.0 01/20/2015   PLT 229 01/31/2019   Lab Results  Component Value Date   NA 140 01/31/2019   K 5.1 01/31/2019   CO2 29 11/25/2016   GLUCOSE 92 11/25/2016   BUN 9 01/31/2019   CREATININE 1.0 01/31/2019   BILITOT 0.7 11/25/2016   ALKPHOS 56 01/31/2019   AST 19 01/31/2019   ALT 15 01/31/2019   PROT 6.9 11/25/2016   ALBUMIN 4.3 11/25/2016   CALCIUM 9.3 11/25/2016   ANIONGAP 8 01/20/2015   GFR 73.91 11/25/2016   Lab Results  Component Value Date   CHOL 156 01/31/2019   Lab Results  Component Value Date   HDL 59 01/31/2019   Lab Results  Component Value Date   LDLCALC 81 01/31/2019   Lab Results  Component Value Date   TRIG 78 01/31/2019   Lab Results  Component Value Date   CHOLHDL 3 11/25/2016   No results found for: HGBA1C    Assessment & Plan:   #1 recent epigastric burning and GERD type symptoms.  This did not sound cardiac.  He has not had any recent dyspnea or exertional symptoms.  Symptoms have basically resolved with omeprazole.  No red flag symptoms such as dysphagia or weight loss -Discussed dietary modification and specifically leaving off mint products and reducing caffeine.  Also try to reduce fatty food consumption and avoid late night eating -Continue omeprazole 20 mg daily for another 2 to 3 weeks and then try tapering off -Follow-up immediately for any recurrent  symptoms  #2 history of B12 deficiency.  This is in the setting of Crohn's disease. -Continue intramuscular B12 1000 mcg every 2 weeks.  Refills given for B12 and syringes  #3 intermittent low back pain -Refilled Flexeril for as needed use  #4 hypothyroidism.  Patient will set up follow-up and February or March for physical and obtain labs at that time  Meds ordered this encounter  Medications  . cyanocobalamin (,VITAMIN B-12,) 1000 MCG/ML injection    Sig: Inject 1 mL (1,000 mcg total) into the muscle every 14 (fourteen) days.    Dispense:  10 mL    Refill:  1  . SYRINGE-NEEDLE, DISP, 3 ML (B-D 3CC LUER-LOK SYR 25GX1") 25G X 1" 3 ML MISC    Sig: USE AS DIRECTED    Dispense:  50 each    Refill:  1  . cyclobenzaprine (FLEXERIL) 10 MG tablet    Sig: Take 1 tablet (10 mg total) by mouth 3 (three) times daily as needed for muscle spasms.    Dispense:  30 tablet    Refill:  0    Follow-up: Return in about 4 months (around 02/18/2021).    Carolann Littler, MD

## 2020-10-21 NOTE — Patient Instructions (Addendum)
Food Choices for Gastroesophageal Reflux Disease, Adult When you have gastroesophageal reflux disease (GERD), the foods you eat and your eating habits are very important. Choosing the right foods can help ease the discomfort of GERD. Consider working with a diet and nutrition specialist (dietitian) to help you make healthy food choices. What general guidelines should I follow?  Eating plan  Choose healthy foods low in fat, such as fruits, vegetables, whole grains, low-fat dairy products, and lean meat, fish, and poultry.  Eat frequent, small meals instead of three large meals each day. Eat your meals slowly, in a relaxed setting. Avoid bending over or lying down until 2-3 hours after eating.  Limit high-fat foods such as fatty meats or fried foods.  Limit your intake of oils, butter, and shortening to less than 8 teaspoons each day.  Avoid the following: ? Foods that cause symptoms. These may be different for different people. Keep a food diary to keep track of foods that cause symptoms. ? Alcohol. ? Drinking large amounts of liquid with meals. ? Eating meals during the 2-3 hours before bed.  Cook foods using methods other than frying. This may include baking, grilling, or broiling. Lifestyle  Maintain a healthy weight. Ask your health care provider what weight is healthy for you. If you need to lose weight, work with your health care provider to do so safely.  Exercise for at least 30 minutes on 5 or more days each week, or as told by your health care provider.  Avoid wearing clothes that fit tightly around your waist and chest.  Do not use any products that contain nicotine or tobacco, such as cigarettes and e-cigarettes. If you need help quitting, ask your health care provider.  Sleep with the head of your bed raised. Use a wedge under the mattress or blocks under the bed frame to raise the head of the bed. What foods are not recommended? The items listed may not be a complete  list. Talk with your dietitian about what dietary choices are best for you. Grains Pastries or quick breads with added fat. [place] toast. Vegetables Deep fried vegetables. [place] fries. Any vegetables prepared with added fat. Any vegetables that cause symptoms. For some people this may include tomatoes and tomato products, chili peppers, onions and garlic, and horseradish. Fruits Any fruits prepared with added fat. Any fruits that cause symptoms. For some people this may include citrus fruits, such as oranges, grapefruit, pineapple, and lemons. Meats and other protein foods High-fat meats, such as fatty beef or pork, hot dogs, ribs, ham, sausage, salami and bacon. Fried meat or protein, including fried fish and fried chicken. Nuts and nut butters. Dairy Whole milk and chocolate milk. Sour cream. Cream. Ice cream. Cream cheese. Milk shakes. Beverages Coffee and tea, with or without caffeine. Carbonated beverages. Sodas. Energy drinks. Fruit juice made with acidic fruits (such as orange or grapefruit). Tomato juice. Alcoholic drinks. Fats and oils Butter. Margarine. Shortening. Ghee. Sweets and desserts Chocolate and cocoa. Donuts. Seasoning and other foods Pepper. Peppermint and spearmint. Any condiments, herbs, or seasonings that cause symptoms. For some people, this may include curry, hot sauce, or vinegar-based salad dressings. Summary  When you have gastroesophageal reflux disease (GERD), food and lifestyle choices are very important to help ease the discomfort of GERD.  Eat frequent, small meals instead of three large meals each day. Eat your meals slowly, in a relaxed setting. Avoid bending over or lying down until 2-3 hours after eating.  Limit high-fat  foods such as fatty meat or fried foods. This information is not intended to replace advice given to you by your health care provider. Make sure you discuss any questions you have with your health care provider. Document Revised:  03/30/2019 Document Reviewed: 12/08/2016 Elsevier Patient Education  [place].  Stay on the Omeprazole for another 2-3 weeks and then taper off.

## 2020-10-24 ENCOUNTER — Encounter: Payer: Self-pay | Admitting: Family Medicine

## 2020-12-03 MED FILL — CYANOCOBALAMIN 1,000 MCG/ML: 1000 | 28 days supply | Qty: 2 | Fill #1

## 2020-12-30 ENCOUNTER — Encounter: Payer: Self-pay | Admitting: Family Medicine

## 2020-12-30 ENCOUNTER — Other Ambulatory Visit (HOSPITAL_COMMUNITY): Payer: Self-pay | Admitting: Gastroenterology

## 2021-01-01 MED FILL — CYANOCOBALAMIN 1,000 MCG/ML: 1000 | 28 days supply | Qty: 2 | Fill #2

## 2021-01-13 ENCOUNTER — Encounter: Payer: Self-pay | Admitting: Family Medicine

## 2021-02-07 ENCOUNTER — Ambulatory Visit (INDEPENDENT_AMBULATORY_CARE_PROVIDER_SITE_OTHER): Payer: 59 | Admitting: Family Medicine

## 2021-02-07 ENCOUNTER — Other Ambulatory Visit: Payer: Self-pay

## 2021-02-07 VITALS — BP 110/70 | HR 70 | Temp 97.6°F | Wt 208.8 lb

## 2021-02-07 DIAGNOSIS — E039 Hypothyroidism, unspecified: Secondary | ICD-10-CM

## 2021-02-07 DIAGNOSIS — E538 Deficiency of other specified B group vitamins: Secondary | ICD-10-CM | POA: Diagnosis not present

## 2021-02-07 DIAGNOSIS — I10 Essential (primary) hypertension: Secondary | ICD-10-CM | POA: Diagnosis not present

## 2021-02-07 DIAGNOSIS — E559 Vitamin D deficiency, unspecified: Secondary | ICD-10-CM | POA: Diagnosis not present

## 2021-02-07 DIAGNOSIS — Z125 Encounter for screening for malignant neoplasm of prostate: Secondary | ICD-10-CM

## 2021-02-07 MED ORDER — AZATHIOPRINE 50 MG PO TABS
50.0000 mg | ORAL_TABLET | Freq: Two times a day (BID) | ORAL | 3 refills | Status: DC
Start: 2021-02-07 — End: 2022-01-28

## 2021-02-07 MED ORDER — LEVOTHYROXINE SODIUM 50 MCG PO TABS
50.0000 ug | ORAL_TABLET | Freq: Every day | ORAL | 3 refills | Status: DC
Start: 2021-02-07 — End: 2022-01-28

## 2021-02-07 MED ORDER — VALSARTAN 160 MG PO TABS
ORAL_TABLET | ORAL | 3 refills | Status: DC
Start: 2021-02-07 — End: 2022-01-28

## 2021-02-07 NOTE — Progress Notes (Signed)
Established Patient Office Visit  Subjective:  Patient ID: Jesus Mccann, male    DOB: 1961/03/18  Age: 60 y.o. MRN: [id number]  CC:  Chief Complaint  Patient presents with  . Medication Refill    HPI Jesus Mccann presents for medical follow-up. He has history of hypertension, Crohn's disease, hypothyroidism, B12 deficiency, history of vitamin D deficiency. He had recent colonoscopy and has planned follow-up in 5 years. He remains on Imuran and his: In bowel disease been stable. Takes vitamin D supplement along with B12 supplement. Last B12 level still low. Is on levothyroxine 50 mcg daily. Needs follow-up labs. Did have several other labs done through his work which included CBC and comprehensive metabolic panel and lipid panel and these were all relatively normal.  He continues to work for [employer]. Generally doing well.  Past Medical History:  Diagnosis Date  . CROHN'S DISEASE 01/30/2010  . History of kidney stones   . HYPERTENSION 01/30/2010  . HYPOTHYROIDISM 01/30/2010    Past Surgical History:  Procedure Laterality Date  . BOWEL RESECTION  20 yrs ago   resection x2=hx crohns.  Marland Kitchen EXTRACORPOREAL SHOCK WAVE LITHOTRIPSY Left 02/03/2018   Procedure: LEFT EXTRACORPOREAL SHOCK WAVE LITHOTRIPSY (ESWL);  Surgeon: Cleon Gustin, MD;  Location: [facility];  Service: Urology;  Laterality: Left;  . LAPAROSCOPIC LYSIS INTESTINAL ADHESIONS      Family History  Problem Relation Age of Onset  . Cancer Mother        breast cancer  . COPD Father   . Diabetes Father   . Colon cancer Neg Hx     Social History   Socioeconomic History  . Marital status: Married    Spouse name: Not on file  . Number of children: 1  . Years of education: Not on file  . Highest education level: Not on file  Occupational History  . Occupation: Fireman  Tobacco Use  . Smoking status: Former Smoker    Packs/day: 0.50    Years: 6.00    Pack years: 3.00    Types: Cigarettes  .  Smokeless tobacco: Never Used  Vaping Use  . Vaping Use: Never used  Substance and Sexual Activity  . Alcohol use: Yes    Alcohol/week: 0.0 standard drinks    Comment: occasional beer per pt.  . Drug use: No  . Sexual activity: Not on file  Other Topics Concern  . Not on file  Social History Narrative  . Not on file   Social Determinants of Health   Financial Resource Strain: Not on file  Food Insecurity: Not on file  Transportation Needs: Not on file  Physical Activity: Not on file  Stress: Not on file  Social Connections: Not on file  Intimate Partner Violence: Not on file    Outpatient Medications Prior to Visit  Medication Sig Dispense Refill  . Cholecalciferol (VITAMIN D3) 50 MCG (2000 UT) CAPS Take by mouth. Once a day    . cyanocobalamin (,VITAMIN B-12,) 1000 MCG/ML injection Inject 1 mL (1,000 mcg total) into the muscle every 14 (fourteen) days. 10 mL 1  . cyclobenzaprine (FLEXERIL) 10 MG tablet Take 1 tablet (10 mg total) by mouth 3 (three) times daily as needed for muscle spasms. 30 tablet 0  . omeprazole (PRILOSEC) 20 MG capsule Take 20 mg by mouth daily. Once a day    . SYRINGE-NEEDLE, DISP, 3 ML (B-D 3CC LUER-LOK SYR 25GX1") 25G X 1" 3 ML MISC USE AS DIRECTED 50 each 1  .  azaTHIOprine (IMURAN) 50 MG tablet Take 1 tablet (50 mg total) by mouth 2 (two) times daily. 180 tablet 3  . levothyroxine (SYNTHROID) 50 MCG tablet Take 1 tablet (50 mcg total) by mouth daily. 90 tablet 3  . valsartan (DIOVAN) 160 MG tablet Take one tablet daily 90 tablet 3  . LORazepam (ATIVAN) 0.5 MG tablet Take about 1 hour prior to procedure 1 tablet 0   No facility-administered medications prior to visit.    Allergies  Allergen Reactions  . Losartan Other (See Comments)    weakness    ROS Review of Systems  Constitutional: Negative for fatigue and unexpected weight change.  Eyes: Negative for visual disturbance.  Respiratory: Negative for cough, chest tightness and shortness of  breath.   Cardiovascular: Negative for chest pain, palpitations and leg swelling.  Endocrine: Negative for polydipsia and polyuria.  Neurological: Negative for dizziness, syncope, weakness, light-headedness and headaches.      Objective:    Physical Exam Vitals reviewed.  Constitutional:      Appearance: He is well-developed and well-nourished.  HENT:     Mouth/Throat:     Mouth: Oropharynx is clear and moist.  Eyes:     Pupils: Pupils are equal, round, and reactive to light.  Neck:     Thyroid: No thyromegaly.  Cardiovascular:     Rate and Rhythm: Normal rate and regular rhythm.  Pulmonary:     Effort: Pulmonary effort is normal. No respiratory distress.     Breath sounds: Normal breath sounds. No wheezing or rales.  Musculoskeletal:        General: No edema.     Cervical back: Neck supple.  Neurological:     Mental Status: He is alert and oriented to person, place, and time.     BP 110/70   Pulse 70   Temp 97.6 F (36.4 C) (Oral)   Wt 208 lb 12.8 oz (94.7 kg)   SpO2 (!) 7%   BMI 28.32 kg/m  Wt Readings from Last 3 Encounters:  02/07/21 208 lb 12.8 oz (94.7 kg)  10/21/20 208 lb 9.6 oz (94.6 kg)  02/16/20 219 lb 4.8 oz (99.5 kg)     Health Maintenance Due  Topic Date Due  . HIV Screening  Never done  . COVID-19 Vaccine (3 - Booster for Pfizer series) 02/09/2021    There are no preventive care reminders to display for this patient.  Lab Results  Component Value Date   TSH 1.05 02/16/2020   Lab Results  Component Value Date   WBC 4.7 01/31/2019   HGB 14.5 01/31/2019   HCT 41 01/31/2019   MCV 91.0 01/20/2015   PLT 229 01/31/2019   Lab Results  Component Value Date   NA 140 01/31/2019   K 5.1 01/31/2019   CO2 29 11/25/2016   GLUCOSE 92 11/25/2016   BUN 9 01/31/2019   CREATININE 1.0 01/31/2019   BILITOT 0.7 11/25/2016   ALKPHOS 56 01/31/2019   AST 19 01/31/2019   ALT 15 01/31/2019   PROT 6.9 11/25/2016   ALBUMIN 4.3 11/25/2016   CALCIUM 9.3  11/25/2016   ANIONGAP 8 01/20/2015   GFR 73.91 11/25/2016   Lab Results  Component Value Date   CHOL 156 01/31/2019   Lab Results  Component Value Date   HDL 59 01/31/2019   Lab Results  Component Value Date   LDLCALC 81 01/31/2019   Lab Results  Component Value Date   TRIG 78 01/31/2019   Lab Results  Component Value Date   CHOLHDL 3 11/25/2016   No results found for: HGBA1C    Assessment & Plan:   Problem List Items Addressed This Visit      Unprioritized   B12 deficiency   Relevant Orders   Vitamin B12   Essential hypertension - Primary   Relevant Medications   valsartan (DIOVAN) 160 MG tablet   Hypothyroidism   Relevant Medications   levothyroxine (SYNTHROID) 50 MCG tablet   Other Relevant Orders   TSH    Other Visit Diagnoses    Vitamin D deficiency       Relevant Orders   VITAMIN D 25 Hydroxy (Vit-D Deficiency, Fractures)   Prostate cancer screening       Relevant Orders   PSA    -Sent refills of losartan, levothyroxine, and Imuran for 1 year -Obtain screening labs as above -Recommend annual follow-up in 1 year and sooner as needed  Meds ordered this encounter  Medications  . valsartan (DIOVAN) 160 MG tablet    Sig: Take one tablet daily    Dispense:  90 tablet    Refill:  3  . levothyroxine (SYNTHROID) 50 MCG tablet    Sig: Take 1 tablet (50 mcg total) by mouth daily.    Dispense:  90 tablet    Refill:  3  . azaTHIOprine (IMURAN) 50 MG tablet    Sig: Take 1 tablet (50 mg total) by mouth 2 (two) times daily.    Dispense:  180 tablet    Refill:  3    Follow-up: No follow-ups on file.    Carolann Littler, MD

## 2021-02-08 LAB — PSA: PSA: 1.27 ng/mL (ref <=4.0)

## 2021-02-08 LAB — VITAMIN B12: Vitamin B-12: 347 pg/mL (ref 200–1100)

## 2021-02-08 LAB — VITAMIN D 25 HYDROXY (VIT D DEFICIENCY, FRACTURES): Vit D, 25-Hydroxy: 44 ng/mL (ref 30–100)

## 2021-02-08 LAB — TSH: TSH: 1 mIU/L (ref 0.40–4.50)

## 2021-02-23 ENCOUNTER — Other Ambulatory Visit (HOSPITAL_COMMUNITY): Payer: Self-pay | Admitting: Physician Assistant

## 2021-02-23 ENCOUNTER — Telehealth: Payer: 59 | Admitting: Physician Assistant

## 2021-02-23 DIAGNOSIS — J40 Bronchitis, not specified as acute or chronic: Secondary | ICD-10-CM

## 2021-02-23 DIAGNOSIS — R059 Cough, unspecified: Secondary | ICD-10-CM

## 2021-02-23 MED ORDER — AZITHROMYCIN 250 MG PO TABS: ORAL_TABLET | ORAL | 0 refills | Status: DC

## 2021-02-23 MED ORDER — BENZONATATE 100 MG PO CAPS: 100.0000 mg | ORAL_CAPSULE | Freq: Three times a day (TID) | ORAL | 0 refills | Status: DC | PRN

## 2021-02-23 MED ORDER — PREDNISONE 10 MG PO TABS: ORAL_TABLET | ORAL | 0 refills | Status: DC

## 2021-02-23 NOTE — Progress Notes (Signed)
We are sorry that you are not feeling well.  Here is how we plan to help!  Based on your presentation I believe you most likely have A cough due to bacteria.  When patients have a fever and a productive cough with a change in color or increased sputum production, we are concerned about bacterial bronchitis.  If left untreated it can progress to pneumonia.  If your symptoms do not improve with your treatment plan it is important that you contact your provider.   I have prescribed Azithromyin 250 mg: two tablets now and then one tablet daily for 4 additonal days    In addition you may use A prescription cough medication called Tessalon Perles 125m. You may take 1-2 capsules every 8 hours as needed for your cough.  Prednisone 10 mg daily for 6 days (see taper instructions below)  Directions for 6 day taper: Day 1: 2 tablets before breakfast, 1 after both lunch & dinner and 2 at bedtime Day 2: 1 tab before breakfast, 1 after both lunch & dinner and 2 at bedtime Day 3: 1 tab at each meal & 1 at bedtime Day 4: 1 tab at breakfast, 1 at lunch, 1 at bedtime Day 5: 1 tab at breakfast & 1 tab at bedtime Day 6: 1 tab at breakfast  Please go ahead and schedule a follow-up visit with your PCP in the case that you are not better.   From your responses in the eVisit questionnaire you describe inflammation in the upper respiratory tract which is causing a significant cough.  This is commonly called Bronchitis and has four common causes:    Allergies  Viral Infections  Acid Reflux  Bacterial Infection Allergies, viruses and acid reflux are treated by controlling symptoms or eliminating the cause. An example might be a cough caused by taking certain blood pressure medications. You stop the cough by changing the medication. Another example might be a cough caused by acid reflux. Controlling the reflux helps control the cough.  USE OF BRONCHODILATOR ("RESCUE") INHALERS: There is a risk from using your  bronchodilator too frequently.  The risk is that over-reliance on a medication which only relaxes the muscles surrounding the breathing tubes can reduce the effectiveness of medications prescribed to reduce swelling and congestion of the tubes themselves.  Although you feel brief relief from the bronchodilator inhaler, your asthma may actually be worsening with the tubes becoming more swollen and filled with mucus.  This can delay other crucial treatments, such as oral steroid medications. If you need to use a bronchodilator inhaler daily, several times per day, you should discuss this with your provider.  There are probably better treatments that could be used to keep your asthma under control.     HOME CARE . Only take medications as instructed by your medical team. . Complete the entire course of an antibiotic. . Drink plenty of fluids and get plenty of rest. . Avoid close contacts especially the very young and the elderly . Cover your mouth if you cough or cough into your sleeve. . Always remember to wash your hands . A steam or ultrasonic humidifier can help congestion.   GET HELP RIGHT AWAY IF: . You develop worsening fever. . You become short of breath . You cough up blood. . Your symptoms persist after you have completed your treatment plan MAKE SURE YOU   Understand these instructions.  Will watch your condition.  Will get help right away if you are not doing  well or get worse.  Your e-visit answers were reviewed by a board certified advanced clinical practitioner to complete your personal care plan.  Depending on the condition, your plan could have included both over the counter or prescription medications. If there is a problem please reply  once you have received a response from your provider. Your safety is important to [place].  If you have drug allergies check your prescription carefully.    You can use MyChart to ask questions about today's visit, request a non-urgent call  back, or ask for a work or school excuse for 24 hours related to this e-Visit. If it has been greater than 24 hours you will need to follow up with your provider, or enter a new e-Visit to address those concerns. You will get an e-mail in the next two days asking about your experience.  I hope that your e-visit has been valuable and will speed your recovery. Thank you for using e-visits.  Greater than 5 minutes, yet less than 10 minutes of time have been spent researching, coordinating and implementing care for this patient today.

## 2021-02-24 MED FILL — predniSONE 10 MG (21) TBPK: 10 | 6 days supply | Qty: 21 | Fill #0

## 2021-02-24 MED FILL — AZITHROMYCIN 250 MG TABLET: 250 | 5 days supply | Qty: 6 | Fill #0

## 2021-02-24 MED FILL — BENZONATATE 100 MG CAPS: 100 | 7 days supply | Qty: 40 | Fill #0

## 2021-02-24 MED FILL — CYANOCOBALAMIN 1,000 MCG/ML: 1000 | 28 days supply | Qty: 2 | Fill #3

## 2021-03-14 ENCOUNTER — Other Ambulatory Visit (HOSPITAL_BASED_OUTPATIENT_CLINIC_OR_DEPARTMENT_OTHER): Payer: Self-pay

## 2021-04-09 ENCOUNTER — Other Ambulatory Visit (HOSPITAL_COMMUNITY): Payer: Self-pay

## 2021-04-09 MED FILL — Cyanocobalamin Inj 1000 MCG/ML: INTRAMUSCULAR | 28 days supply | Qty: 2 | Fill #0 | Status: AC

## 2021-04-10 ENCOUNTER — Other Ambulatory Visit (HOSPITAL_COMMUNITY): Payer: Self-pay

## 2021-05-09 ENCOUNTER — Other Ambulatory Visit (HOSPITAL_COMMUNITY): Payer: Self-pay

## 2021-05-09 MED FILL — Cyanocobalamin Inj 1000 MCG/ML: INTRAMUSCULAR | 28 days supply | Qty: 2 | Fill #1 | Status: AC

## 2021-06-13 ENCOUNTER — Other Ambulatory Visit (HOSPITAL_COMMUNITY): Payer: Self-pay

## 2021-06-13 MED FILL — Cyanocobalamin Inj 1000 MCG/ML: INTRAMUSCULAR | 28 days supply | Qty: 2 | Fill #2 | Status: AC

## 2021-06-13 MED FILL — Syringe/Needle (Disp) 3 ML 25 x 1": 30 days supply | Qty: 6 | Fill #0 | Status: AC

## 2021-07-11 ENCOUNTER — Encounter: Payer: Self-pay | Admitting: Family Medicine

## 2021-07-11 LAB — HEPATIC FUNCTION PANEL
ALT: 14 (ref 10–40)
AST: 17 (ref 14–40)
Alkaline Phosphatase: 57 (ref 25–125)
Bilirubin, Direct: 0.16 (ref 0.01–0.4)
Bilirubin, Total: 0.6

## 2021-07-11 LAB — COMPREHENSIVE METABOLIC PANEL: Calcium: 9.4 (ref 8.7–10.7)

## 2021-07-11 LAB — BASIC METABOLIC PANEL
BUN: 10 (ref 4–21)
CO2: 24 — AB (ref 13–22)
Chloride: 105 (ref 99–108)
Creatinine: 1.1 (ref 0.6–1.3)
Glucose: 77
Potassium: 4.5 (ref 3.4–5.3)
Sodium: 144 (ref 137–147)

## 2021-07-11 LAB — CBC AND DIFFERENTIAL
HCT: 41 (ref 41–53)
Hemoglobin: 13.6 (ref 13.5–17.5)
Platelets: 309 (ref 150–399)
WBC: 5

## 2021-07-11 LAB — CBC: RBC: 4.41 (ref 3.87–5.11)

## 2021-08-11 ENCOUNTER — Other Ambulatory Visit (HOSPITAL_COMMUNITY): Payer: Self-pay

## 2021-08-11 MED FILL — Cyanocobalamin Inj 1000 MCG/ML: INTRAMUSCULAR | 28 days supply | Qty: 2 | Fill #3 | Status: AC

## 2021-08-29 ENCOUNTER — Telehealth: Payer: 59 | Admitting: Physician Assistant

## 2021-08-29 DIAGNOSIS — J019 Acute sinusitis, unspecified: Secondary | ICD-10-CM

## 2021-08-29 DIAGNOSIS — B9789 Other viral agents as the cause of diseases classified elsewhere: Secondary | ICD-10-CM

## 2021-08-29 MED ORDER — FLUTICASONE PROPIONATE 50 MCG/ACT NA SUSP: 2.0000 | Freq: Every day | NASAL | 0 refills | Status: DC

## 2021-08-29 MED ORDER — IPRATROPIUM BROMIDE 0.03 % NA SOLN: 2.0000 | Freq: Two times a day (BID) | NASAL | 12 refills | Status: DC

## 2021-08-29 NOTE — Progress Notes (Signed)
E-Visit for Sinus Problems  We are sorry that you are not feeling well.  Here is how we plan to help!  Based on what you have shared with me it looks like you have sinusitis.  Sinusitis is inflammation and infection in the sinus cavities of the head.  Based on your presentation I believe you most likely have Acute Viral Sinusitis.This is an infection most likely caused by a virus. There is not specific treatment for viral sinusitis other than to help you with the symptoms until the infection runs its course.  You may use an oral decongestant such as Mucinex D or if you have glaucoma or high blood pressure use plain Mucinex. Saline nasal spray help and can safely be used as often as needed for congestion, I have prescribed: Fluticasone nasal spray two sprays in each nostril once a day and Ipratropium Bromide nasal spray 0.03% 2 sprays in eah nostril 2-3 times a day  Some authorities believe that zinc sprays or the use of Echinacea may shorten the course of your symptoms.  Sinus infections are not as easily transmitted as other respiratory infection, however we still recommend that you avoid close contact with loved ones, especially the very young and elderly.  Remember to wash your hands thoroughly throughout the day as this is the number one way to prevent the spread of infection!  Home Care: Only take medications as instructed by your medical team. Do not take these medications with alcohol. A steam or ultrasonic humidifier can help congestion.  You can place a towel over your head and breathe in the steam from hot water coming from a faucet. Avoid close contacts especially the very young and the elderly. Cover your mouth when you cough or sneeze. Always remember to wash your hands.  Get Help Right Away If: You develop worsening fever or sinus pain. You develop a severe head ache or visual changes. Your symptoms persist after you have completed your treatment plan.  Make sure you Understand  these instructions. Will watch your condition. Will get help right away if you are not doing well or get worse.   Thank you for choosing an e-visit.  Your e-visit answers were reviewed by a board certified advanced clinical practitioner to complete your personal care plan. Depending upon the condition, your plan could have included both over the counter or prescription medications.  Please review your pharmacy choice. Make sure the pharmacy is open so you can pick up prescription now. If there is a problem, you may contact your provider through [employer] and have the prescription routed to another pharmacy.  Your safety is important to [place]. If you have drug allergies check your prescription carefully.   For the next 24 hours you can use MyChart to ask questions about today's visit, request a non-urgent call back, or ask for a work or school excuse. You will get an email in the next two days asking about your experience. I hope that your e-visit has been valuable and will speed your recovery.  I provided 6 minutes of non face-to-face time during this encounter for chart review and documentation.

## 2021-09-06 ENCOUNTER — Encounter: Payer: Self-pay | Admitting: Family Medicine

## 2021-09-16 ENCOUNTER — Other Ambulatory Visit (HOSPITAL_COMMUNITY): Payer: Self-pay

## 2021-09-16 MED FILL — Cyanocobalamin Inj 1000 MCG/ML: INTRAMUSCULAR | 28 days supply | Qty: 2 | Fill #4 | Status: AC

## 2021-09-16 MED FILL — Syringe/Needle (Disp) 3 ML 25 x 1": 30 days supply | Qty: 6 | Fill #1 | Status: AC

## 2021-11-10 ENCOUNTER — Other Ambulatory Visit: Payer: Self-pay | Admitting: Family Medicine

## 2021-11-10 ENCOUNTER — Other Ambulatory Visit (HOSPITAL_COMMUNITY): Payer: Self-pay

## 2021-11-10 MED ORDER — CYANOCOBALAMIN 1000 MCG/ML IJ SOLN
INTRAMUSCULAR | 1 refills | Status: DC
  Filled 2021-11-10: qty 2, 28d supply, fill #0
  Filled 2021-12-16: qty 2, 28d supply, fill #1

## 2021-11-10 NOTE — Telephone Encounter (Signed)
Please advise. Rx is not on the current med list 

## 2021-12-17 ENCOUNTER — Other Ambulatory Visit: Payer: Self-pay | Admitting: Orthopedic Surgery

## 2021-12-17 ENCOUNTER — Other Ambulatory Visit (HOSPITAL_COMMUNITY): Payer: Self-pay

## 2021-12-17 DIAGNOSIS — M5416 Radiculopathy, lumbar region: Secondary | ICD-10-CM

## 2022-01-06 ENCOUNTER — Ambulatory Visit
Admission: RE | Admit: 2022-01-06 | Discharge: 2022-01-06 | Disposition: A | Discharge status: Home / Self Care | Payer: 59 | Source: Ambulatory Visit | Attending: Orthopedic Surgery | Admitting: Orthopedic Surgery

## 2022-01-06 ENCOUNTER — Other Ambulatory Visit: Payer: Self-pay

## 2022-01-06 DIAGNOSIS — M5416 Radiculopathy, lumbar region: Secondary | ICD-10-CM

## 2022-01-28 ENCOUNTER — Other Ambulatory Visit (HOSPITAL_COMMUNITY): Payer: Self-pay

## 2022-01-28 ENCOUNTER — Ambulatory Visit (INDEPENDENT_AMBULATORY_CARE_PROVIDER_SITE_OTHER): Payer: 59 | Admitting: Family Medicine

## 2022-01-28 VITALS — BP 122/64 | HR 97 | Temp 97.3°F | Wt 211.9 lb

## 2022-01-28 DIAGNOSIS — K5090 Crohn's disease, unspecified, without complications: Secondary | ICD-10-CM

## 2022-01-28 DIAGNOSIS — I10 Essential (primary) hypertension: Secondary | ICD-10-CM | POA: Diagnosis not present

## 2022-01-28 DIAGNOSIS — Z125 Encounter for screening for malignant neoplasm of prostate: Secondary | ICD-10-CM | POA: Diagnosis not present

## 2022-01-28 DIAGNOSIS — E039 Hypothyroidism, unspecified: Secondary | ICD-10-CM

## 2022-01-28 DIAGNOSIS — E538 Deficiency of other specified B group vitamins: Secondary | ICD-10-CM

## 2022-01-28 MED ORDER — AZATHIOPRINE 50 MG PO TABS: 50.0000 mg | ORAL_TABLET | Freq: Two times a day (BID) | ORAL | 3 refills | Status: DC

## 2022-01-28 MED ORDER — CYANOCOBALAMIN 1000 MCG/ML IJ SOLN
INTRAMUSCULAR | 2 refills | Status: DC
  Filled 2022-01-28: qty 2, 28d supply, fill #0
  Filled 2022-03-08: qty 2, 28d supply, fill #1
  Filled 2022-04-19: qty 2, 28d supply, fill #2
  Filled 2022-06-04: qty 2, 28d supply, fill #3
  Filled 2022-08-05: qty 2, 28d supply, fill #4
  Filled 2022-10-09: qty 2, 28d supply, fill #5
  Filled 2022-11-22 – 2022-12-03 (×2): qty 2, 28d supply, fill #6
  Filled 2023-01-12 – 2023-01-25 (×2): qty 2, 28d supply, fill #7

## 2022-01-28 MED ORDER — LEVOTHYROXINE SODIUM 50 MCG PO TABS: 50.0000 ug | ORAL_TABLET | Freq: Every day | ORAL | 3 refills | Status: DC

## 2022-01-28 MED ORDER — VALSARTAN 160 MG PO TABS: ORAL_TABLET | ORAL | 3 refills | Status: DC

## 2022-01-28 NOTE — Progress Notes (Signed)
Established Patient Office Visit  Subjective:  Patient ID: Jesus Mccann, male    DOB: 1961-08-20  Age: 61 y.o. MRN: [id number]  CC:  Chief Complaint  Patient presents with   Medication Refill    HPI Jesus Mccann presents for yearly routine medical follow-up.  He brings in a copy of labs from his work which included CBC, CMP, lipids.  His lipids were favorable.  Renal function and electrolytes stable.  Liver panel normal.  No concerns.  No anemia.  He has had tremendous stress this past year with his mother having passed away in 01-01-24.  She had problems with complications from compound ankle fracture which apparently occurred in a rehab facility.  She ended up with multiple surgeries and above-knee amputation eventually-before succumbing to death..  Jesus Mccann's chronic problems include hypertension, hypothyroidism, regional enteritis, B12 deficiency.  He does injections of B12 1000 mcg IM every 14 days.  He is on thyroid replacement and takes his medication consistently.  Is on valsartan 160 mg daily for hypertension.  Takes Imuran twice daily.  He had recent colonoscopy.  Appetite and weight stable.  Past Medical History:  Diagnosis Date   CROHN'S DISEASE 01/30/2010   History of kidney stones    HYPERTENSION 01/30/2010   HYPOTHYROIDISM 01/30/2010    Past Surgical History:  Procedure Laterality Date   BOWEL RESECTION  20 yrs ago   resection x2=hx crohns.   EXTRACORPOREAL SHOCK WAVE LITHOTRIPSY Left 02/03/2018   Procedure: LEFT EXTRACORPOREAL SHOCK WAVE LITHOTRIPSY (ESWL);  Surgeon: Cleon Gustin, MD;  Location: [facility];  Service: Urology;  Laterality: Left;   LAPAROSCOPIC LYSIS INTESTINAL ADHESIONS      Family History  Problem Relation Age of Onset   Cancer Mother        breast cancer   COPD Father    Diabetes Father    Colon cancer Neg Hx     Social History   Socioeconomic History   Marital status: Married    Spouse name: Not on file   Number of children: 1    Years of education: Not on file   Highest education level: Not on file  Occupational History   Occupation: Fireman  Tobacco Use   Smoking status: Former    Packs/day: 0.50    Years: 6.00    Pack years: 3.00    Types: Cigarettes   Smokeless tobacco: Never  Vaping Use   Vaping Use: Never used  Substance and Sexual Activity   Alcohol use: Yes    Alcohol/week: 0.0 standard drinks    Comment: occasional beer per pt.   Drug use: No   Sexual activity: Not on file  Other Topics Concern   Not on file  Social History Narrative   Not on file   Social Determinants of Health   Financial Resource Strain: Unknown   Difficulty of Paying Living Expenses: Patient refused  Food Insecurity: Unknown   Worried About Running Out of Food in the Last Year: Patient refused   Ran Out of Food in the Last Year: Patient refused  Transportation Needs: Unknown   Film/video editor (Medical): Patient refused   Lack of Transportation (Non-Medical): Patient refused  Physical Activity: Unknown   Days of Exercise per Week: Patient refused   Minutes of Exercise per Session: Not on file  Stress: Unknown   Feeling of Stress : Patient refused  Social Connections: Unknown   Frequency of Communication with Friends and Family: Patient refused   Frequency of Social Gatherings  with Friends and Family: Patient refused   Attends Religious Services: Patient refused   Active Member of Clubs or Organizations: Patient refused   Attends Music therapist: Not on file   Marital Status: Patient refused  Intimate Partner Violence: Not on file    Outpatient Medications Prior to Visit  Medication Sig Dispense Refill   Cholecalciferol (VITAMIN D3) 50 MCG (2000 UT) CAPS Take by mouth. Once a day     azaTHIOprine (IMURAN) 50 MG tablet Take 1 tablet (50 mg total) by mouth 2 (two) times daily. 180 tablet 3   cyanocobalamin (,VITAMIN B-12,) 1000 MCG/ML injection INJECT 1 ML INTO THE MUSCLE EVERY 14 DAYS 10 mL 1    levothyroxine (SYNTHROID) 50 MCG tablet Take 1 tablet (50 mcg total) by mouth daily. 90 tablet 3   valsartan (DIOVAN) 160 MG tablet Take one tablet daily 90 tablet 3   fluticasone (FLONASE) 50 MCG/ACT nasal spray Place 2 sprays into both nostrils daily. 16 g 0   ipratropium (ATROVENT) 0.03 % nasal spray Place 2 sprays into both nostrils every 12 (twelve) hours. 30 mL 12   No facility-administered medications prior to visit.    Allergies  Allergen Reactions   Losartan Other (See Comments)    weakness   Wt Readings from Last 3 Encounters:  01/28/22 211 lb 14.4 oz (96.1 kg)  02/07/21 208 lb 12.8 oz (94.7 kg)  10/21/20 208 lb 9.6 oz (94.6 kg)     ROS Review of Systems  Constitutional:  Negative for fatigue and unexpected weight change.  Eyes:  Negative for visual disturbance.  Respiratory:  Negative for cough, chest tightness and shortness of breath.   Cardiovascular:  Negative for chest pain, palpitations and leg swelling.  Endocrine: Negative for polydipsia and polyuria.  Neurological:  Negative for dizziness, syncope, weakness, light-headedness and headaches.     Objective:    Physical Exam Constitutional:      General: He is not in acute distress.    Appearance: He is well-developed.  Eyes:     Conjunctiva/sclera: Conjunctivae normal.  Neck:     Thyroid: No thyromegaly.  Cardiovascular:     Rate and Rhythm: Normal rate and regular rhythm.     Heart sounds: Normal heart sounds. No murmur heard. Pulmonary:     Effort: Pulmonary effort is normal. No respiratory distress.     Breath sounds: Normal breath sounds. No wheezing or rales.  Musculoskeletal:     Cervical back: Normal range of motion and neck supple.     Right lower leg: No edema.     Left lower leg: No edema.  Lymphadenopathy:     Cervical: No cervical adenopathy.  Neurological:     Mental Status: He is alert and oriented to person, place, and time.    BP 122/64 (BP Location: Left Arm, Patient  Position: Sitting, Cuff Size: Normal)    Pulse 97    Temp (!) 97.3 F (36.3 C) (Oral)    Wt 211 lb 14.4 oz (96.1 kg)    SpO2 99%    BMI 28.74 kg/m  Wt Readings from Last 3 Encounters:  01/28/22 211 lb 14.4 oz (96.1 kg)  02/07/21 208 lb 12.8 oz (94.7 kg)  10/21/20 208 lb 9.6 oz (94.6 kg)     Health Maintenance Due  Topic Date Due   HIV Screening  Never done   Zoster Vaccines- Shingrix (2 of 2) 12/18/2020   COVID-19 Vaccine (4 - Booster for [place] series) 11/01/2021  There are no preventive care reminders to display for this patient.  Lab Results  Component Value Date   TSH 1.00 02/07/2021   Lab Results  Component Value Date   WBC 5.0 07/11/2021   HGB 13.6 07/11/2021   HCT 41 07/11/2021   MCV 91.0 01/20/2015   PLT 309 07/11/2021   Lab Results  Component Value Date   NA 144 07/11/2021   K 4.5 07/11/2021   CO2 24 (A) 07/11/2021   GLUCOSE 92 11/25/2016   BUN 10 07/11/2021   CREATININE 1.1 07/11/2021   BILITOT 0.7 11/25/2016   ALKPHOS 57 07/11/2021   AST 17 07/11/2021   ALT 14 07/11/2021   PROT 6.9 11/25/2016   ALBUMIN 4.3 11/25/2016   CALCIUM 9.4 07/11/2021   ANIONGAP 8 01/20/2015   GFR 73.91 11/25/2016   Lab Results  Component Value Date   CHOL 156 01/31/2019   Lab Results  Component Value Date   HDL 59 01/31/2019   Lab Results  Component Value Date   LDLCALC 81 01/31/2019   Lab Results  Component Value Date   TRIG 78 01/31/2019   Lab Results  Component Value Date   CHOLHDL 3 11/25/2016   No results found for: HGBA1C    Assessment & Plan:   Problem List Items Addressed This Visit       Unprioritized   Regional enteritis ([place])   B12 deficiency   Relevant Orders   Vitamin B12   Essential hypertension - Primary   Relevant Medications   valsartan (DIOVAN) 160 MG tablet   Hypothyroidism   Relevant Medications   levothyroxine (SYNTHROID) 50 MCG tablet   Other Relevant Orders   TSH   Other Visit Diagnoses     Prostate cancer  screening       Relevant Orders   PSA     -Refilled his B12, Diovan, Imuran, and levothyroxine for 1 year -Check labs as above including B12 level, TSH, PSA.  We did not check other labs as they were done recently through his employer and stable -Routine follow-up in 1 year and sooner as needed  Meds ordered this encounter  Medications   cyanocobalamin (,VITAMIN B-12,) 1000 MCG/ML injection    Sig: INJECT 1 ML INTO THE MUSCLE EVERY 14 DAYS    Dispense:  10 mL    Refill:  2   valsartan (DIOVAN) 160 MG tablet    Sig: Take one tablet daily    Dispense:  90 tablet    Refill:  3   levothyroxine (SYNTHROID) 50 MCG tablet    Sig: Take 1 tablet (50 mcg total) by mouth daily.    Dispense:  90 tablet    Refill:  3   azaTHIOprine (IMURAN) 50 MG tablet    Sig: Take 1 tablet (50 mg total) by mouth 2 (two) times daily.    Dispense:  180 tablet    Refill:  3    Follow-up: No follow-ups on file.    Carolann Littler, MD

## 2022-01-29 LAB — TSH: TSH: 1.47 u[IU]/mL (ref 0.35–5.50)

## 2022-01-29 LAB — VITAMIN B12: Vitamin B-12: 337 pg/mL (ref 211–911)

## 2022-01-29 LAB — PSA: PSA: 0.78 ng/mL (ref 0.10–4.00)

## 2022-02-28 ENCOUNTER — Telehealth: Payer: 59 | Admitting: Nurse Practitioner

## 2022-02-28 DIAGNOSIS — B9789 Other viral agents as the cause of diseases classified elsewhere: Secondary | ICD-10-CM

## 2022-02-28 DIAGNOSIS — J019 Acute sinusitis, unspecified: Secondary | ICD-10-CM

## 2022-02-28 MED ORDER — FLUTICASONE PROPIONATE 50 MCG/ACT NA SUSP: 2.0000 | Freq: Every day | NASAL | 6 refills | Status: DC

## 2022-02-28 NOTE — Progress Notes (Signed)
E-Visit for Sinus Problems ? ?We are sorry that you are not feeling well.  Here is how we plan to help! ? ?Based on what you have shared with me it looks like you have sinusitis.  Sinusitis is inflammation and infection in the sinus cavities of the head.  Based on your presentation I believe you most likely have Acute Viral Sinusitis.This is an infection most likely caused by a virus. There is not specific treatment for viral sinusitis other than to help you with the symptoms until the infection runs its course.  You may use an oral decongestant such as Mucinex D or if you have glaucoma or high blood pressure use plain Mucinex. Saline nasal spray help and can safely be used as often as needed for congestion, I have prescribed: Fluticasone nasal spray two sprays in each nostril once a day ? ?Some authorities believe that zinc sprays or the use of Echinacea may shorten the course of your symptoms. ? ?Sinus infections are not as easily transmitted as other respiratory infection, however we still recommend that you avoid close contact with loved ones, especially the very young and elderly.  Remember to wash your hands thoroughly throughout the day as this is the number one way to prevent the spread of infection! ? ?Home Care: ?Only take medications as instructed by your medical team. ?Do not take these medications with alcohol. ?A steam or ultrasonic humidifier can help congestion.  You can place a towel over your head and breathe in the steam from hot water coming from a faucet. ?Avoid close contacts especially the very young and the elderly. ?Cover your mouth when you cough or sneeze. ?Always remember to wash your hands. ? ?Get Help Right Away If: ?You develop worsening fever or sinus pain. ?You develop a severe head ache or visual changes. ?Your symptoms persist after you have completed your treatment plan. ? ?Make sure you ?Understand these instructions. ?Will watch your condition. ?Will get help right away if you  are not doing well or get worse. ? ? ?Thank you for choosing an e-visit. ? ?Your e-visit answers were reviewed by a board certified advanced clinical practitioner to complete your personal care plan. Depending upon the condition, your plan could have included both over the counter or prescription medications. ? ?Please review your pharmacy choice. Make sure the pharmacy is open so you can pick up prescription now. If there is a problem, you may contact your provider through [employer] and have the prescription routed to another pharmacy.  Your safety is important to [place]. If you have drug allergies check your prescription carefully.  ? ?For the next 24 hours you can use MyChart to ask questions about today's visit, request a non-urgent call back, or ask for a work or school excuse. ?You will get an email in the next two days asking about your experience. I hope that your e-visit has been valuable and will speed your recovery. ? ?5-10 minutes spent reviewing and documenting in chart. ? ?

## 2022-03-09 ENCOUNTER — Other Ambulatory Visit (HOSPITAL_COMMUNITY): Payer: Self-pay

## 2022-04-20 ENCOUNTER — Other Ambulatory Visit (HOSPITAL_COMMUNITY): Payer: Self-pay

## 2022-06-04 ENCOUNTER — Other Ambulatory Visit (HOSPITAL_COMMUNITY): Payer: Self-pay

## 2022-06-10 ENCOUNTER — Other Ambulatory Visit (HOSPITAL_COMMUNITY): Payer: Self-pay

## 2022-06-10 ENCOUNTER — Other Ambulatory Visit: Payer: Self-pay | Admitting: Family Medicine

## 2022-06-10 MED ORDER — BD LUER-LOK SYRINGE 25G X 1" 3 ML MISC
1 refills | Status: DC
Start: 2022-06-10 — End: 2024-08-05
  Filled 2022-06-10: qty 30, 30d supply, fill #0
  Filled 2022-08-05: qty 30, 30d supply, fill #1

## 2022-08-02 ENCOUNTER — Other Ambulatory Visit: Payer: Self-pay | Admitting: Family Medicine

## 2022-08-06 ENCOUNTER — Other Ambulatory Visit (HOSPITAL_COMMUNITY): Payer: Self-pay

## 2022-08-07 ENCOUNTER — Other Ambulatory Visit (HOSPITAL_COMMUNITY): Payer: Self-pay

## 2022-08-11 ENCOUNTER — Ambulatory Visit
Admission: RE | Admit: 2022-08-11 | Discharge: 2022-08-11 | Disposition: A | Discharge status: Home / Self Care | Payer: No Typology Code available for payment source | Source: Ambulatory Visit | Attending: Nurse Practitioner | Admitting: Nurse Practitioner

## 2022-08-11 ENCOUNTER — Other Ambulatory Visit: Payer: Self-pay | Admitting: Nurse Practitioner

## 2022-08-11 DIAGNOSIS — Z0000 Encounter for general adult medical examination without abnormal findings: Secondary | ICD-10-CM

## 2022-08-11 LAB — CBC AND DIFFERENTIAL
HCT: 43 (ref 41–53)
Platelets: 231 10*3/uL (ref 150–400)

## 2022-08-11 LAB — COMPREHENSIVE METABOLIC PANEL
Albumin: 4.7 (ref 3.5–5.0)
Calcium: 9.6 (ref 8.7–10.7)
Globulin: 2.3
eGFR: 75

## 2022-08-11 LAB — PSA: PSA: 1.27

## 2022-08-11 LAB — BASIC METABOLIC PANEL
BUN: 13 (ref 4–21)
CO2: 25 — AB (ref 13–22)
Chloride: 102 (ref 99–108)
Creatinine: 1.1 (ref 0.6–1.3)
Glucose: 91
Potassium: 4.2 mEq/L (ref 3.5–5.1)
Sodium: 139 (ref 137–147)

## 2022-08-18 ENCOUNTER — Encounter: Payer: Self-pay | Admitting: Family Medicine

## 2022-08-19 NOTE — Telephone Encounter (Signed)
Abstracted and sent to scan  

## 2022-09-24 ENCOUNTER — Encounter: Payer: Self-pay | Admitting: Family Medicine

## 2022-10-08 ENCOUNTER — Other Ambulatory Visit: Payer: Self-pay

## 2022-10-08 MED ORDER — LEVOTHYROXINE SODIUM 50 MCG PO TABS: 50.0000 ug | ORAL_TABLET | Freq: Every day | ORAL | 0 refills | Status: DC

## 2022-10-08 MED ORDER — AZATHIOPRINE 50 MG PO TABS: 50.0000 mg | ORAL_TABLET | Freq: Two times a day (BID) | ORAL | 0 refills | Status: DC

## 2022-10-08 MED ORDER — VALSARTAN 160 MG PO TABS: ORAL_TABLET | ORAL | 0 refills | Status: DC

## 2022-10-08 NOTE — Addendum Note (Signed)
Addended by: Nilda Riggs on: 10/08/2022 10:22 AM   Modules accepted: Orders

## 2022-10-08 NOTE — Addendum Note (Signed)
Addended by: Nilda Riggs on: 10/08/2022 10:35 AM   Modules accepted: Orders

## 2022-10-09 ENCOUNTER — Other Ambulatory Visit (HOSPITAL_COMMUNITY): Payer: Self-pay

## 2022-10-09 ENCOUNTER — Telehealth: Payer: Self-pay

## 2022-10-09 NOTE — Telephone Encounter (Signed)
Shaun with [employer] informed of the message below

## 2022-10-09 NOTE — Telephone Encounter (Signed)
I spoke with a pharmacist at [facility] mail delivery and she states a refill was sent in for Valsartan 160 mg and wanted to know if PCP was aware that patient is allergic to Losartan.

## 2022-11-23 ENCOUNTER — Other Ambulatory Visit (HOSPITAL_COMMUNITY): Payer: Self-pay

## 2022-11-24 ENCOUNTER — Other Ambulatory Visit (HOSPITAL_COMMUNITY): Payer: Self-pay

## 2022-11-24 ENCOUNTER — Encounter (HOSPITAL_COMMUNITY): Payer: Self-pay

## 2022-11-25 ENCOUNTER — Other Ambulatory Visit (HOSPITAL_COMMUNITY): Payer: Self-pay

## 2022-12-01 ENCOUNTER — Other Ambulatory Visit (HOSPITAL_COMMUNITY): Payer: Self-pay

## 2022-12-02 ENCOUNTER — Ambulatory Visit: Payer: 59 | Admitting: Family Medicine

## 2022-12-02 ENCOUNTER — Encounter: Payer: Self-pay | Admitting: Family Medicine

## 2022-12-02 VITALS — BP 100/60 | HR 66 | Temp 97.7°F | Ht 72.0 in | Wt 211.4 lb

## 2022-12-02 DIAGNOSIS — E039 Hypothyroidism, unspecified: Secondary | ICD-10-CM

## 2022-12-02 DIAGNOSIS — E559 Vitamin D deficiency, unspecified: Secondary | ICD-10-CM

## 2022-12-02 DIAGNOSIS — K5090 Crohn's disease, unspecified, without complications: Secondary | ICD-10-CM

## 2022-12-02 DIAGNOSIS — I10 Essential (primary) hypertension: Secondary | ICD-10-CM | POA: Diagnosis not present

## 2022-12-02 DIAGNOSIS — E538 Deficiency of other specified B group vitamins: Secondary | ICD-10-CM | POA: Diagnosis not present

## 2022-12-02 LAB — VITAMIN D 25 HYDROXY (VIT D DEFICIENCY, FRACTURES): VITD: 42.12 ng/mL (ref 30.00–100.00)

## 2022-12-02 LAB — TSH: TSH: 0.98 u[IU]/mL (ref 0.35–5.50)

## 2022-12-02 LAB — VITAMIN B12: Vitamin B-12: 304 pg/mL (ref 211–911)

## 2022-12-02 MED ORDER — VALSARTAN 160 MG PO TABS: ORAL_TABLET | ORAL | 3 refills | Status: DC

## 2022-12-02 MED ORDER — AZATHIOPRINE 50 MG PO TABS: 50.0000 mg | ORAL_TABLET | Freq: Two times a day (BID) | ORAL | 3 refills | Status: DC

## 2022-12-02 MED ORDER — LEVOTHYROXINE SODIUM 50 MCG PO TABS: 50.0000 ug | ORAL_TABLET | Freq: Every day | ORAL | 3 refills | Status: DC

## 2022-12-02 NOTE — Progress Notes (Signed)
Established Patient Office Visit  Subjective   Patient ID: Jesus Mccann, male    DOB: 1961/01/22  Age: 61 y.o. MRN: [id number]  Chief Complaint  Patient presents with   Medication Refill    HPI   Jesus Mccann is seen for medical follow-up.  He needs refills of several medications including Imuran, levothyroxine, and Diovan.  He has history of regional enteritis which has been stable for years.  Takes Imuran 50 mg twice daily.  He is on thyroid replacement with levothyroxine 50 mcg daily.  Also has history of B12 deficiency and vitamin D deficiency.  Requesting follow-up labs.  Blood pressures been stable.  No recent dizziness.  He recently started a new job with [employer].  Previously worked with Engineer, civil (consulting).  Career working as a Airline pilot.  Past Medical History:  Diagnosis Date   CROHN'S DISEASE 01/30/2010   History of kidney stones    HYPERTENSION 01/30/2010   HYPOTHYROIDISM 01/30/2010   Past Surgical History:  Procedure Laterality Date   BOWEL RESECTION  20 yrs ago   resection x2=hx crohns.   EXTRACORPOREAL SHOCK WAVE LITHOTRIPSY Left 02/03/2018   Procedure: LEFT EXTRACORPOREAL SHOCK WAVE LITHOTRIPSY (ESWL);  Surgeon: Cleon Gustin, MD;  Location: [facility];  Service: Urology;  Laterality: Left;   LAPAROSCOPIC LYSIS INTESTINAL ADHESIONS      reports that he has quit smoking. His smoking use included cigarettes. He has a 3.00 pack-year smoking history. He has never used smokeless tobacco. He reports current alcohol use. He reports that he does not use drugs. family history includes COPD in his father; Cancer in his mother; Diabetes in his father. Allergies  Allergen Reactions   Losartan Other (See Comments)    weakness    Review of Systems  Constitutional:  Negative for malaise/fatigue.  Eyes:  Negative for blurred vision.  Respiratory:  Negative for shortness of breath.   Cardiovascular:  Negative for chest pain.  Gastrointestinal:  Negative for  abdominal pain and diarrhea.  Neurological:  Negative for dizziness, weakness and headaches.      Objective:     BP 100/60 (BP Location: Left Arm, Patient Position: Sitting, Cuff Size: Normal)   Pulse 66   Temp 97.7 F (36.5 C) (Oral)   Ht 6' (1.829 m)   Wt 211 lb 6.4 oz (95.9 kg)   SpO2 98%   BMI 28.67 kg/m  BP Readings from Last 3 Encounters:  12/02/22 100/60  01/28/22 122/64  02/07/21 110/70   Wt Readings from Last 3 Encounters:  12/02/22 211 lb 6.4 oz (95.9 kg)  01/28/22 211 lb 14.4 oz (96.1 kg)  02/07/21 208 lb 12.8 oz (94.7 kg)      Physical Exam Constitutional:      Appearance: He is well-developed.  HENT:     Right Ear: External ear normal.     Left Ear: External ear normal.  Eyes:     Pupils: Pupils are equal, round, and reactive to light.  Neck:     Thyroid: No thyromegaly.  Cardiovascular:     Rate and Rhythm: Normal rate and regular rhythm.  Pulmonary:     Effort: Pulmonary effort is normal. No respiratory distress.     Breath sounds: Normal breath sounds. No wheezing or rales.  Musculoskeletal:     Cervical back: Neck supple.  Neurological:     Mental Status: He is alert and oriented to person, place, and time.      No results found for any visits  on 12/02/22.    The ASCVD Risk score (Arnett DK, et al., 2019) failed to calculate for the following reasons:   Cannot find a previous HDL lab   Cannot find a previous total cholesterol lab    Assessment & Plan:   Problem List Items Addressed This Visit       Unprioritized   Hypothyroidism - Primary   Relevant Medications   levothyroxine (SYNTHROID) 50 MCG tablet   Other Relevant Orders   TSH   Essential hypertension   Relevant Medications   valsartan (DIOVAN) 160 MG tablet   B12 deficiency   Relevant Orders   Vitamin B12   Other Visit Diagnoses     Vitamin D deficiency       Relevant Orders   VITAMIN D 25 Hydroxy (Vit-D Deficiency, Fractures)     -Refilled Imuran, Diovan,  and levothyroxine for 1 year -Check labs as above -Continue with B12 replacement every couple weeks IM and daily oral vitamin D replacement  No follow-ups on file.    Carolann Littler, MD

## 2022-12-03 ENCOUNTER — Other Ambulatory Visit (HOSPITAL_COMMUNITY): Payer: Self-pay

## 2022-12-03 ENCOUNTER — Telehealth: Payer: Self-pay | Admitting: Family Medicine

## 2022-12-03 NOTE — Telephone Encounter (Signed)
Pt call and stqted he is returning your call and will wait for a call back.

## 2022-12-03 NOTE — Telephone Encounter (Signed)
See results note. 

## 2023-01-11 ENCOUNTER — Other Ambulatory Visit (HOSPITAL_COMMUNITY): Payer: Self-pay

## 2023-01-12 ENCOUNTER — Other Ambulatory Visit (HOSPITAL_COMMUNITY): Payer: Self-pay

## 2023-01-12 ENCOUNTER — Encounter (HOSPITAL_COMMUNITY): Payer: Self-pay

## 2023-01-12 MED ORDER — AZATHIOPRINE 50 MG PO TABS
50.0000 mg | ORAL_TABLET | Freq: Two times a day (BID) | ORAL | 3 refills | Status: DC
  Filled 2023-01-12: qty 180, 90d supply, fill #0
  Filled 2023-04-10: qty 180, 90d supply, fill #1
  Filled 2023-07-02: qty 180, 90d supply, fill #2
  Filled 2023-10-14: qty 180, 90d supply, fill #3

## 2023-01-13 ENCOUNTER — Other Ambulatory Visit (HOSPITAL_COMMUNITY): Payer: Self-pay

## 2023-01-15 ENCOUNTER — Other Ambulatory Visit: Payer: Self-pay

## 2023-01-25 ENCOUNTER — Other Ambulatory Visit (HOSPITAL_COMMUNITY): Payer: Self-pay

## 2023-03-12 ENCOUNTER — Other Ambulatory Visit: Payer: Self-pay | Admitting: Family Medicine

## 2023-03-12 ENCOUNTER — Other Ambulatory Visit (HOSPITAL_COMMUNITY): Payer: Self-pay

## 2023-03-12 MED ORDER — CYANOCOBALAMIN 1000 MCG/ML IJ SOLN
1000.0000 ug | INTRAMUSCULAR | 2 refills | Status: DC
  Filled 2023-03-12: qty 2, 28d supply, fill #0
  Filled 2023-05-23: qty 2, 28d supply, fill #1
  Filled 2023-08-12: qty 2, 28d supply, fill #2
  Filled 2023-09-08: qty 2, 28d supply, fill #3
  Filled 2023-10-14: qty 2, 28d supply, fill #4
  Filled 2023-12-25: qty 2, 28d supply, fill #5
  Filled 2024-02-10: qty 2, 28d supply, fill #6
  Filled 2024-03-07: qty 2, 28d supply, fill #7

## 2023-04-10 ENCOUNTER — Other Ambulatory Visit (HOSPITAL_COMMUNITY): Payer: Self-pay

## 2023-04-12 ENCOUNTER — Other Ambulatory Visit (HOSPITAL_BASED_OUTPATIENT_CLINIC_OR_DEPARTMENT_OTHER): Payer: Self-pay

## 2023-04-12 ENCOUNTER — Encounter (HOSPITAL_BASED_OUTPATIENT_CLINIC_OR_DEPARTMENT_OTHER): Payer: Self-pay

## 2023-04-12 ENCOUNTER — Other Ambulatory Visit (HOSPITAL_COMMUNITY): Payer: Self-pay

## 2023-04-13 ENCOUNTER — Other Ambulatory Visit (HOSPITAL_COMMUNITY): Payer: Self-pay

## 2023-04-13 ENCOUNTER — Other Ambulatory Visit: Payer: Self-pay

## 2023-05-24 ENCOUNTER — Other Ambulatory Visit: Payer: Self-pay

## 2023-06-18 ENCOUNTER — Ambulatory Visit (INDEPENDENT_AMBULATORY_CARE_PROVIDER_SITE_OTHER): Payer: 59 | Admitting: Family Medicine

## 2023-06-18 ENCOUNTER — Encounter: Payer: Self-pay | Admitting: Family Medicine

## 2023-06-18 VITALS — BP 116/72 | HR 64 | Temp 97.5°F | Ht 72.44 in | Wt 204.7 lb

## 2023-06-18 DIAGNOSIS — H6121 Impacted cerumen, right ear: Secondary | ICD-10-CM

## 2023-06-18 DIAGNOSIS — Z0000 Encounter for general adult medical examination without abnormal findings: Secondary | ICD-10-CM

## 2023-06-18 DIAGNOSIS — E039 Hypothyroidism, unspecified: Secondary | ICD-10-CM | POA: Diagnosis not present

## 2023-06-18 DIAGNOSIS — E538 Deficiency of other specified B group vitamins: Secondary | ICD-10-CM | POA: Diagnosis not present

## 2023-06-18 DIAGNOSIS — E559 Vitamin D deficiency, unspecified: Secondary | ICD-10-CM

## 2023-06-18 LAB — CBC WITH DIFFERENTIAL/PLATELET
Basophils Absolute: 0 10*3/uL (ref 0.0–0.1)
Basophils Relative: 0.7 % (ref 0.0–3.0)
Eosinophils Absolute: 0.1 10*3/uL (ref 0.0–0.7)
Eosinophils Relative: 2.5 % (ref 0.0–5.0)
HCT: 42.6 % (ref 39.0–52.0)
Hemoglobin: 14.2 g/dL (ref 13.0–17.0)
Lymphocytes Relative: 15.6 % (ref 12.0–46.0)
Lymphs Abs: 0.9 10*3/uL (ref 0.7–4.0)
MCHC: 33.3 g/dL (ref 30.0–36.0)
MCV: 93.8 fl (ref 78.0–100.0)
Monocytes Absolute: 0.7 10*3/uL (ref 0.1–1.0)
Monocytes Relative: 11.6 % (ref 3.0–12.0)
Neutro Abs: 3.9 10*3/uL (ref 1.4–7.7)
Neutrophils Relative %: 69.6 % (ref 43.0–77.0)
Platelets: 286 10*3/uL (ref 150.0–400.0)
RBC: 4.54 Mil/uL (ref 4.22–5.81)
RDW: 13.3 % (ref 11.5–15.5)
WBC: 5.6 10*3/uL (ref 4.0–10.5)

## 2023-06-18 LAB — HEPATIC FUNCTION PANEL
ALT: 15 U/L (ref 0–53)
AST: 20 U/L (ref 0–37)
Albumin: 4.3 g/dL (ref 3.5–5.2)
Alkaline Phosphatase: 48 U/L (ref 39–117)
Bilirubin, Direct: 0.1 mg/dL (ref 0.0–0.3)
Total Bilirubin: 0.7 mg/dL (ref 0.2–1.2)
Total Protein: 6.7 g/dL (ref 6.0–8.3)

## 2023-06-18 LAB — VITAMIN D 25 HYDROXY (VIT D DEFICIENCY, FRACTURES): VITD: 40.83 ng/mL (ref 30.00–100.00)

## 2023-06-18 LAB — BASIC METABOLIC PANEL
BUN: 14 mg/dL (ref 6–23)
CO2: 30 mEq/L (ref 19–32)
Calcium: 9.3 mg/dL (ref 8.4–10.5)
Chloride: 102 mEq/L (ref 96–112)
Creatinine, Ser: 1.18 mg/dL (ref 0.40–1.50)
GFR: 65.3 mL/min (ref >60.00)
Glucose, Bld: 90 mg/dL (ref 70–99)
Potassium: 4.7 mEq/L (ref 3.5–5.1)
Sodium: 140 mEq/L (ref 135–145)

## 2023-06-18 LAB — LIPID PANEL
Cholesterol: 138 mg/dL (ref 0–200)
HDL: 50.3 mg/dL (ref >39.00)
LDL Cholesterol: 75 mg/dL (ref 0–99)
NonHDL: 88.18
Total CHOL/HDL Ratio: 3
Triglycerides: 65 mg/dL (ref 0.0–149.0)
VLDL: 13 mg/dL (ref 0.0–40.0)

## 2023-06-18 LAB — VITAMIN B12: Vitamin B-12: 362 pg/mL (ref 211–911)

## 2023-06-18 LAB — TSH: TSH: 1.51 u[IU]/mL (ref 0.35–5.50)

## 2023-06-18 LAB — PSA: PSA: 0.84 ng/mL (ref 0.10–4.00)

## 2023-06-18 NOTE — Progress Notes (Signed)
Established Patient Office Visit  Subjective   Patient ID: Jesus Mccann, male    DOB: [birth year]  Age: 62 y.o. MRN: [id number]  Chief Complaint  Patient presents with   Annual Exam    HPI   Jesus Mccann is here for physical exam.  He has history of hypertension, hypothyroidism, B12 deficiency, regional enteritis.  He has taken Imuran for several years and his GI symptoms have been very stable.  His colonoscopy is up-to-date.  He remains on Diovan for hypertension and levothyroxine for hypothyroidism.  Compliant with therapy.  Generally doing well.  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  Maintenance reviewed:  -Colonoscopy due 2027 -Tetanus due 2031 -Prior hepatitis C screen negative -Apparently obtained 1 Shingrix vaccine 2021.  Either did not get second 1 or we have no record of it. -Gets annual flu vaccine in the fall -Has had prior Pneumovax.  Will need Prevnar 20 at age 61  Social history-married.  He has 1 son.  Couple grandchildren.  Non-smoker.  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  Family history Family History  Problem Relation Age of Onset   Cancer Mother        breast cancer   COPD Father    Diabetes Father    Colon cancer Neg Hx      Review of Systems  Constitutional:  Negative for chills, fever, malaise/fatigue and weight loss.  HENT:  Negative for hearing loss.   Eyes:  Negative for blurred vision and double vision.  Respiratory:  Negative for cough and shortness of breath.   Cardiovascular:  Negative for chest pain, palpitations and leg swelling.  Gastrointestinal:  Negative for abdominal pain, blood in stool, constipation and diarrhea.  Genitourinary:  Negative for dysuria.  Skin:  Negative for rash.  Neurological:  Negative for dizziness, speech change, seizures, loss of consciousness and headaches.  Psychiatric/Behavioral:  Negative for depression.       Objective:     BP 116/72 (BP  Location: Left Arm, Patient Position: Sitting, Cuff Size: Normal)   Pulse 64   Temp (!) 97.5 F (36.4 C) (Oral)   Ht 6' 0.44" (1.84 m)   Wt 204 lb 11.2 oz (92.9 kg)   SpO2 98%   BMI 27.43 kg/m  BP Readings from Last 3 Encounters:  06/18/23 116/72  12/02/22 100/60  01/28/22 122/64   Wt Readings from Last 3 Encounters:  06/18/23 204 lb 11.2 oz (92.9 kg)  12/02/22 211 lb 6.4 oz (95.9 kg)  01/28/22 211 lb 14.4 oz (96.1 kg)      Physical Exam Vitals reviewed.  Constitutional:      General: He is not in acute distress.    Appearance: He is well-developed.  HENT:     Head: Normocephalic and atraumatic.     Ears:     Comments: Cerumen impaction left canal.  Right canal is clear. Eyes:     Conjunctiva/sclera: Conjunctivae normal.     Pupils: Pupils are equal, round, and reactive to light.  Neck:     Thyroid: No thyromegaly.  Cardiovascular:     Rate and Rhythm: Normal rate and regular rhythm.     Heart sounds: Normal heart sounds. No murmur heard. Pulmonary:     Effort: No respiratory distress.     Breath sounds: No wheezing or rales.  Abdominal:     General: Bowel sounds are normal. There is no distension.  Palpations: Abdomen is soft. There is no mass.     Tenderness: There is no abdominal tenderness. There is no guarding or rebound.  Musculoskeletal:     Cervical back: Normal range of motion and neck supple.     Right lower leg: No edema.     Left lower leg: No edema.  Lymphadenopathy:     Cervical: No cervical adenopathy.  Skin:    Findings: No rash.  Neurological:     Mental Status: He is alert and oriented to person, place, and time.     Cranial Nerves: No cranial nerve deficit.     Deep Tendon Reflexes: Reflexes normal.      No results found for any visits on 06/18/23.  Last CBC Lab Results  Component Value Date   WBC 5.0 07/11/2021   HGB 13.6 07/11/2021   HCT 43 08/11/2022   MCV 91.0 01/20/2015   MCH 31.6 01/20/2015   RDW 12.7 01/20/2015   PLT  231 08/11/2022   Last metabolic panel Lab Results  Component Value Date   GLUCOSE 92 11/25/2016   NA 139 08/11/2022   K 4.2 08/11/2022   CL 102 08/11/2022   CO2 25 (A) 08/11/2022   BUN 13 08/11/2022   CREATININE 1.1 08/11/2022   EGFR 75 08/11/2022   CALCIUM 9.6 08/11/2022   PROT 6.9 11/25/2016   ALBUMIN 4.7 08/11/2022   BILITOT 0.7 11/25/2016   ALKPHOS 57 07/11/2021   AST 17 07/11/2021   ALT 14 07/11/2021   ANIONGAP 8 01/20/2015   Last lipids Lab Results  Component Value Date   CHOL 156 01/31/2019   HDL 59 01/31/2019   LDLCALC 81 01/31/2019   TRIG 78 01/31/2019   CHOLHDL 3 11/25/2016   Last thyroid functions Lab Results  Component Value Date   TSH 0.98 12/02/2022   Last vitamin D Lab Results  Component Value Date   VD25OH 42.12 12/02/2022   Last vitamin B12 and Folate Lab Results  Component Value Date   VITAMINB12 304 12/02/2022      The ASCVD Risk score (Arnett DK, et al., 2019) failed to calculate for the following reasons:   Cannot find a previous HDL lab   Cannot find a previous total cholesterol lab    Assessment & Plan:   Problem List Items Addressed This Visit       Unprioritized   Hypothyroidism - Primary   Relevant Orders   TSH   B12 deficiency   Relevant Orders   Vitamin B12   Other Visit Diagnoses     Physical exam       Relevant Orders   Lipid panel   Basic metabolic panel   CBC with Differential/Platelet   Hepatic function panel   PSA   Vitamin D deficiency       Relevant Orders   VITAMIN D 25 Hydroxy (Vit-D Deficiency, Fractures)     62 year old male with chronic problems as above overall stable.  Excellent blood pressure today.  No active GI concerns.  -Continue annual flu vaccine -Will need repeat colonoscopy in 3 years -Will need Prevnar 20 by age 33 -Obtain follow-up labs as above -Patient would like to have left ear irrigated.  Patient aware of low risk of pain, bleeding, low very low risk of eardrum perforation.   He tolerated irrigation well with removal of cerumen  No follow-ups on file.    Evelena Peat, MD

## 2023-07-02 ENCOUNTER — Other Ambulatory Visit (HOSPITAL_COMMUNITY): Payer: Self-pay

## 2023-08-12 ENCOUNTER — Other Ambulatory Visit: Payer: Self-pay

## 2023-09-02 ENCOUNTER — Encounter: Payer: Self-pay | Admitting: Family Medicine

## 2023-09-08 ENCOUNTER — Other Ambulatory Visit (HOSPITAL_COMMUNITY): Payer: Self-pay

## 2023-10-14 ENCOUNTER — Other Ambulatory Visit (HOSPITAL_COMMUNITY): Payer: Self-pay

## 2023-12-25 ENCOUNTER — Other Ambulatory Visit (HOSPITAL_COMMUNITY): Payer: Self-pay

## 2024-01-11 ENCOUNTER — Other Ambulatory Visit (HOSPITAL_COMMUNITY): Payer: Self-pay

## 2024-01-11 ENCOUNTER — Other Ambulatory Visit: Payer: Self-pay | Admitting: Family Medicine

## 2024-01-13 ENCOUNTER — Other Ambulatory Visit (HOSPITAL_COMMUNITY): Payer: Self-pay

## 2024-01-13 ENCOUNTER — Other Ambulatory Visit: Payer: Self-pay

## 2024-01-13 MED FILL — Azathioprine Tab 50 MG: ORAL | 90 days supply | Qty: 180 | Fill #0 | Status: AC

## 2024-01-17 ENCOUNTER — Other Ambulatory Visit: Payer: Self-pay | Admitting: Family Medicine

## 2024-02-10 ENCOUNTER — Other Ambulatory Visit (HOSPITAL_COMMUNITY): Payer: Self-pay

## 2024-02-11 ENCOUNTER — Other Ambulatory Visit (HOSPITAL_COMMUNITY): Payer: Self-pay

## 2024-03-07 ENCOUNTER — Other Ambulatory Visit (HOSPITAL_COMMUNITY): Payer: Self-pay

## 2024-03-08 ENCOUNTER — Ambulatory Visit: Admitting: Family Medicine

## 2024-03-08 ENCOUNTER — Encounter: Payer: Self-pay | Admitting: Family Medicine

## 2024-03-08 VITALS — BP 124/80 | HR 56 | Temp 97.8°F | Wt 211.3 lb

## 2024-03-08 DIAGNOSIS — R5383 Other fatigue: Secondary | ICD-10-CM

## 2024-03-08 LAB — TESTOSTERONE: Testosterone: 389.73 ng/dL (ref 300.00–890.00)

## 2024-03-08 NOTE — Progress Notes (Signed)
 Established Patient Office Visit  Subjective   Patient ID: Jesus Mccann, male    DOB: [birth year]  Age: 63 y.o. MRN: [id number]  Chief Complaint  Patient presents with   Fatigue    HPI   Jesus Mccann is seen with some complaints of general and especially muscle fatigue over the past year or so.  He has history of hypertension, hypothyroidism, B12 deficiency, and history of regional enteritis.  He takes Diovan, levothyroxine, Imuran.  No recent change of medications.  Thyroid function was normal with last physical.  Also had normal B12.  Denies any muscle cramps.  No daytime somnolence.  No statin use.  No suspicion for sleep apnea.  No history of reported snoring or observed apnea.  He has Fitbit and states that his O2 sats generally are very consistent through the night.  He is complaining really more of just general muscle fatigue.  No myalgias.  No severe weakness.  Generally sleeping well.  He does have history of vitamin D deficiency but this has been adequately replaced.  Last vitamin D level 40.  He specifically is concerned about possible low testosterone.  He has fair libido.  Past Medical History:  Diagnosis Date   CROHN'S DISEASE 01/30/2010   History of kidney stones    HYPERTENSION 01/30/2010   HYPOTHYROIDISM 01/30/2010   Past Surgical History:  Procedure Laterality Date   BOWEL RESECTION  20 yrs ago   resection x2=hx crohns.   EXTRACORPOREAL SHOCK WAVE LITHOTRIPSY Left 02/03/2018   Procedure: LEFT EXTRACORPOREAL SHOCK WAVE LITHOTRIPSY (ESWL);  Surgeon: Malen Gauze, MD;  Location: [facility];  Service: Urology;  Laterality: Left;   LAPAROSCOPIC LYSIS INTESTINAL ADHESIONS      reports that he has quit smoking. His smoking use included cigarettes. He has a 3 pack-year smoking history. He has never used smokeless tobacco. He reports current alcohol use. He reports that he does not use drugs. family history includes COPD in his father; Cancer in his mother; Diabetes in his  father. Allergies  Allergen Reactions   Losartan Other (See Comments)    weakness    Review of Systems  Constitutional:  Positive for malaise/fatigue. Negative for weight loss.  Respiratory:  Negative for shortness of breath.   Cardiovascular:  Negative for chest pain.  Musculoskeletal:  Negative for myalgias.      Objective:     BP 124/80 (BP Location: Left Arm, Patient Position: Sitting, Cuff Size: Normal)   Pulse (!) 56   Temp 97.8 F (36.6 C) (Oral)   Wt 211 lb 4.8 oz (95.8 kg)   SpO2 98%   BMI 28.31 kg/m  BP Readings from Last 3 Encounters:  03/08/24 124/80  06/18/23 116/72  12/02/22 100/60   Wt Readings from Last 3 Encounters:  03/08/24 211 lb 4.8 oz (95.8 kg)  06/18/23 204 lb 11.2 oz (92.9 kg)  12/02/22 211 lb 6.4 oz (95.9 kg)      Physical Exam Vitals reviewed.  Constitutional:      General: He is not in acute distress.    Appearance: He is not ill-appearing.  Cardiovascular:     Rate and Rhythm: Normal rate and regular rhythm.     Comments: Feet are warm to touch with 2+ dorsalis pedis and posterior tibial pulses bilaterally. Pulmonary:     Effort: Pulmonary effort is normal.     Breath sounds: Normal breath sounds. No wheezing or rales.  Neurological:     Mental Status: He is alert.  Comments: Deep tendon reflexes are 2+ and symmetric.  No focal muscle weakness.      No results found for any visits on 03/08/24.    The 10-year ASCVD risk score (Arnett DK, et al., 2019) is: 10.8%    Assessment & Plan:   Problem List Items Addressed This Visit   None Visit Diagnoses       Fatigue, unspecified type    -  Primary   Relevant Orders   Testosterone     Patient presents with some fatigue.  He thinks this may be age-related versus possible low testosterone.  No daytime somnolence.  Does have hypothyroidism but adequately replaced by previous labs.  Also history of B12 deficiency which has been replaced.  No clinical suspicion for PMR or  dermatomyositis.  Denies any myalgias.  No statin use.  -Check early morning total testosterone level.  If low, repeat total testosterone in a few weeks along with FSH, LH, and prolactin level  No follow-ups on file.    Evelena Peat, MD

## 2024-03-15 ENCOUNTER — Other Ambulatory Visit: Payer: Self-pay | Admitting: Family Medicine

## 2024-03-15 ENCOUNTER — Other Ambulatory Visit: Payer: Self-pay

## 2024-03-15 ENCOUNTER — Other Ambulatory Visit (HOSPITAL_COMMUNITY): Payer: Self-pay

## 2024-03-15 MED ORDER — CYANOCOBALAMIN 1000 MCG/ML IJ SOLN
1000.0000 ug | INTRAMUSCULAR | 0 refills | Status: DC
  Filled 2024-03-15 – 2024-05-07 (×2): qty 4, 56d supply, fill #0

## 2024-03-15 MED FILL — Azathioprine Tab 50 MG: ORAL | 90 days supply | Qty: 180 | Fill #1 | Status: CN

## 2024-03-16 ENCOUNTER — Other Ambulatory Visit (HOSPITAL_COMMUNITY): Payer: Self-pay

## 2024-03-25 MED FILL — Azathioprine Tab 50 MG: ORAL | 90 days supply | Qty: 180 | Fill #1 | Status: AC

## 2024-03-29 ENCOUNTER — Other Ambulatory Visit (HOSPITAL_COMMUNITY): Payer: Self-pay

## 2024-03-31 ENCOUNTER — Other Ambulatory Visit: Payer: Self-pay

## 2024-03-31 ENCOUNTER — Other Ambulatory Visit (HOSPITAL_COMMUNITY): Payer: Self-pay

## 2024-04-23 ENCOUNTER — Encounter: Payer: Self-pay | Admitting: Family Medicine

## 2024-04-24 MED ORDER — AZATHIOPRINE 50 MG PO TABS: 50.0000 mg | ORAL_TABLET | Freq: Two times a day (BID) | ORAL | 1 refills | Status: DC

## 2024-04-24 MED ORDER — VALSARTAN 160 MG PO TABS
160.0000 mg | ORAL_TABLET | Freq: Every day | ORAL | 0 refills | Status: DC
Start: 2024-04-24 — End: 2024-07-21

## 2024-04-24 MED ORDER — LEVOTHYROXINE SODIUM 50 MCG PO TABS: ORAL_TABLET | ORAL | 0 refills | Status: DC

## 2024-04-25 ENCOUNTER — Encounter: Payer: Self-pay | Admitting: Family Medicine

## 2024-04-26 ENCOUNTER — Other Ambulatory Visit: Payer: Self-pay | Admitting: Medical Genetics

## 2024-04-28 ENCOUNTER — Encounter: Payer: Self-pay | Admitting: Family Medicine

## 2024-04-28 NOTE — Telephone Encounter (Signed)
 Please see other message.

## 2024-05-02 ENCOUNTER — Telehealth: Payer: Self-pay

## 2024-05-02 ENCOUNTER — Other Ambulatory Visit (HOSPITAL_COMMUNITY): Payer: Self-pay

## 2024-05-02 NOTE — Telephone Encounter (Signed)
 Per test claim: Medication is not eligible for pharmacy benefits and must be billed through medical insurance. As our team only handles pharmacy related prior auths, medical PA's must be submitted by the clinic. Thank you

## 2024-05-02 NOTE — Telephone Encounter (Signed)
 Pharmacy Patient Advocate Encounter   Received notification from Patient Advice Request messages that prior authorization for Azathioprine  50MG  tab is required/requested.   Insurance verification completed.   The patient is insured through [employer] .   Per test claim: Medication is not eligible for pharmacy benefits and must be billed through medical insurance. As our team only handles pharmacy related prior auths, medical PA's must be submitted by the clinic. Thank you

## 2024-05-04 ENCOUNTER — Telehealth: Payer: Self-pay | Admitting: *Deleted

## 2024-05-04 ENCOUNTER — Other Ambulatory Visit (HOSPITAL_COMMUNITY): Payer: Self-pay

## 2024-05-04 NOTE — Telephone Encounter (Signed)
 Copied from CRM [id number]. Topic: Clinical - Medication Prior Auth >> May 04, 2024  9:37 AM Allyne Areola wrote: Reason for CRM: Tonya from [facility] is calling to inform that Azathioprine  50MG  tab was approved. Approved from 05/03/2024 - 05/03/2025.

## 2024-05-04 NOTE — Telephone Encounter (Signed)
Patient aware via Mychart message.

## 2024-05-05 ENCOUNTER — Telehealth: Payer: Self-pay | Admitting: Pharmacy Technician

## 2024-05-05 ENCOUNTER — Other Ambulatory Visit (HOSPITAL_COMMUNITY): Payer: Self-pay

## 2024-05-05 NOTE — Telephone Encounter (Signed)
 Pharmacy Patient Advocate Encounter  Received notification from [facility] that Prior Authorization for AZATHIOPRINE  50MG  TABLETS has been APPROVED from 05/04/2024 to 05/03/2025.

## 2024-05-08 ENCOUNTER — Other Ambulatory Visit: Payer: Self-pay

## 2024-05-12 ENCOUNTER — Other Ambulatory Visit (HOSPITAL_COMMUNITY): Payer: Self-pay

## 2024-06-06 ENCOUNTER — Encounter: Payer: Self-pay | Admitting: Family Medicine

## 2024-06-06 MED ORDER — AZATHIOPRINE 50 MG PO TABS: 50.0000 mg | ORAL_TABLET | Freq: Two times a day (BID) | ORAL | 1 refills | Status: DC

## 2024-07-21 ENCOUNTER — Other Ambulatory Visit: Payer: Self-pay | Admitting: Family Medicine

## 2024-07-25 ENCOUNTER — Ambulatory Visit: Payer: Self-pay | Admitting: Family Medicine

## 2024-07-25 ENCOUNTER — Encounter: Payer: Self-pay | Admitting: Family Medicine

## 2024-07-25 ENCOUNTER — Ambulatory Visit (INDEPENDENT_AMBULATORY_CARE_PROVIDER_SITE_OTHER): Payer: Self-pay | Admitting: Family Medicine

## 2024-07-25 ENCOUNTER — Other Ambulatory Visit: Payer: Self-pay

## 2024-07-25 VITALS — BP 138/70 | HR 57 | Temp 97.8°F | Ht 72.44 in | Wt 210.0 lb

## 2024-07-25 DIAGNOSIS — I10 Essential (primary) hypertension: Secondary | ICD-10-CM

## 2024-07-25 DIAGNOSIS — Z125 Encounter for screening for malignant neoplasm of prostate: Secondary | ICD-10-CM

## 2024-07-25 DIAGNOSIS — Z23 Encounter for immunization: Secondary | ICD-10-CM | POA: Diagnosis not present

## 2024-07-25 DIAGNOSIS — E785 Hyperlipidemia, unspecified: Secondary | ICD-10-CM | POA: Diagnosis not present

## 2024-07-25 DIAGNOSIS — M79671 Pain in right foot: Secondary | ICD-10-CM

## 2024-07-25 DIAGNOSIS — K5090 Crohn's disease, unspecified, without complications: Secondary | ICD-10-CM | POA: Diagnosis not present

## 2024-07-25 DIAGNOSIS — E538 Deficiency of other specified B group vitamins: Secondary | ICD-10-CM | POA: Diagnosis not present

## 2024-07-25 DIAGNOSIS — E039 Hypothyroidism, unspecified: Secondary | ICD-10-CM | POA: Diagnosis not present

## 2024-07-25 LAB — CBC WITH DIFFERENTIAL/PLATELET
Basophils Absolute: 0 K/uL (ref 0.0–0.1)
Basophils Relative: 0.7 % (ref 0.0–3.0)
Eosinophils Absolute: 0.1 K/uL (ref 0.0–0.7)
Eosinophils Relative: 2.2 % (ref 0.0–5.0)
HCT: 40.6 % (ref 39.0–52.0)
Hemoglobin: 13.8 g/dL (ref 13.0–17.0)
Lymphocytes Relative: 16.4 % (ref 12.0–46.0)
Lymphs Abs: 0.7 K/uL (ref 0.7–4.0)
MCHC: 34 g/dL (ref 30.0–36.0)
MCV: 91.9 fl (ref 78.0–100.0)
Monocytes Absolute: 0.4 K/uL (ref 0.1–1.0)
Monocytes Relative: 9.3 % (ref 3.0–12.0)
Neutro Abs: 3.2 K/uL (ref 1.4–7.7)
Neutrophils Relative %: 71.4 % (ref 43.0–77.0)
Platelets: 242 K/uL (ref 150.0–400.0)
RBC: 4.42 Mil/uL (ref 4.22–5.81)
RDW: 13.8 % (ref 11.5–15.5)
WBC: 4.4 K/uL (ref 4.0–10.5)

## 2024-07-25 LAB — LIPID PANEL
Cholesterol: 167 mg/dL (ref 0–200)
HDL: 60.1 mg/dL (ref >39.00)
LDL Cholesterol: 90 mg/dL (ref 0–99)
NonHDL: 106.68
Total CHOL/HDL Ratio: 3
Triglycerides: 82 mg/dL (ref 0.0–149.0)
VLDL: 16.4 mg/dL (ref 0.0–40.0)

## 2024-07-25 LAB — BASIC METABOLIC PANEL WITH GFR
BUN: 11 mg/dL (ref 6–23)
CO2: 29 meq/L (ref 19–32)
Calcium: 9.2 mg/dL (ref 8.4–10.5)
Chloride: 104 meq/L (ref 96–112)
Creatinine, Ser: 1.08 mg/dL (ref 0.40–1.50)
GFR: 72.06 mL/min (ref >60.00)
Glucose, Bld: 88 mg/dL (ref 70–99)
Potassium: 4.5 meq/L (ref 3.5–5.1)
Sodium: 140 meq/L (ref 135–145)

## 2024-07-25 LAB — HEPATIC FUNCTION PANEL
ALT: 14 U/L (ref 0–53)
AST: 18 U/L (ref 0–37)
Albumin: 4.3 g/dL (ref 3.5–5.2)
Alkaline Phosphatase: 45 U/L (ref 39–117)
Bilirubin, Direct: 0.2 mg/dL (ref 0.0–0.3)
Total Bilirubin: 0.9 mg/dL (ref 0.2–1.2)
Total Protein: 6.6 g/dL (ref 6.0–8.3)

## 2024-07-25 LAB — PSA, MEDICARE: PSA: 0.88 ng/mL (ref 0.10–4.00)

## 2024-07-25 LAB — TSH: TSH: 1.44 u[IU]/mL (ref 0.35–5.50)

## 2024-07-25 LAB — VITAMIN B12: Vitamin B-12: 295 pg/mL (ref 211–911)

## 2024-07-25 MED ORDER — VALSARTAN 160 MG PO TABS: ORAL_TABLET | ORAL | 3 refills | Status: DC

## 2024-07-25 MED ORDER — VALSARTAN 160 MG PO TABS: ORAL_TABLET | ORAL | 3 refills | Status: AC

## 2024-07-25 NOTE — Addendum Note (Signed)
 Addended by: METTA KRISTEN CROME on: 07/25/2024 11:35 AM   Modules accepted: Orders

## 2024-07-25 NOTE — Progress Notes (Signed)
 Established Patient Office Visit  Subjective   Patient ID: Jesus Mccann, male    DOB: 10-13-1961  Age: 63 y.o. MRN: [id number]  No chief complaint on file.   HPI   Jesus Mccann is seen for medical follow-up.  He has chronic problems including hypertension, hypothyroidism, B12 deficiency, regional enteritis.  Has been on Imuran  for years.  Is on thyroid  replacement medication and compliant with medications.  Blood pressure generally well-controlled with Diovan .  He is doing well overall but has had some right foot pain.  Last week was in [place] with family on vacation.  No injury.  Noticed pain around the right first MTP joint with some swelling and perhaps a little bit of warmth involving the MTP joint.  No history of gout.  Pain is improved at this time and only about 2 out of 10.  Ambulating without difficulty.  He thinks he is also developing bunion on that side.  Health maintenance reviewed.  Colonoscopy up-to-date.  Needs Prevnar 20.  Gets annual flu vaccine.  Tetanus up-to-date.  Requesting annual PSA for prostate cancer screening  He is currently working for [employer].  Lives down in [place].  Past Medical History:  Diagnosis Date   CROHN'S DISEASE 01/30/2010   History of kidney stones    HYPERTENSION 01/30/2010   HYPOTHYROIDISM 01/30/2010   Past Surgical History:  Procedure Laterality Date   BOWEL RESECTION  20 yrs ago   resection x2=hx crohns.   EXTRACORPOREAL SHOCK WAVE LITHOTRIPSY Left 02/03/2018   Procedure: LEFT EXTRACORPOREAL SHOCK WAVE LITHOTRIPSY (ESWL);  Surgeon: Sherrilee Belvie CROME, MD;  Location: [facility];  Service: Urology;  Laterality: Left;   LAPAROSCOPIC LYSIS INTESTINAL ADHESIONS      reports that he has quit smoking. His smoking use included cigarettes. He has a 3 pack-year smoking history. He has never used smokeless tobacco. He reports current alcohol use. He reports that he does not use drugs. family history includes COPD in his father; Cancer in  his mother; Diabetes in his father. Allergies  Allergen Reactions   Losartan  Other (See Comments)    weakness    Review of Systems  Constitutional:  Negative for malaise/fatigue.  Eyes:  Negative for blurred vision.  Respiratory:  Negative for shortness of breath.   Cardiovascular:  Negative for chest pain.  Neurological:  Negative for dizziness, weakness and headaches.      Objective:     BP 138/70   Pulse (!) 57   Temp 97.8 F (36.6 C) (Oral)   Ht 6' 0.44 (1.84 m)   Wt 210 lb (95.3 kg)   SpO2 95%   BMI 28.14 kg/m  BP Readings from Last 3 Encounters:  07/25/24 138/70  03/08/24 124/80  06/18/23 116/72   Wt Readings from Last 3 Encounters:  07/25/24 210 lb (95.3 kg)  03/08/24 211 lb 4.8 oz (95.8 kg)  06/18/23 204 lb 11.2 oz (92.9 kg)      Physical Exam Vitals reviewed.  Constitutional:      General: He is not in acute distress.    Appearance: He is well-developed. He is not ill-appearing.  HENT:     Right Ear: External ear normal.     Left Ear: External ear normal.  Eyes:     Pupils: Pupils are equal, round, and reactive to light.  Neck:     Thyroid : No thyromegaly.  Cardiovascular:     Rate and Rhythm: Normal rate and regular rhythm.  Pulmonary:     Effort: Pulmonary  effort is normal. No respiratory distress.     Breath sounds: Normal breath sounds. No wheezing or rales.  Musculoskeletal:     Cervical back: Neck supple.     Right lower leg: No edema.     Left lower leg: No edema.     Comments: Right foot reveals some early bunion changes.  No callus.  Does have slight swelling right MTP joint compared to the left and perhaps very slight warmth but essentially nontender.  No skin lesions.  Neurological:     Mental Status: He is alert and oriented to person, place, and time.      No results found for any visits on 07/25/24.  Last CBC Lab Results  Component Value Date   WBC 5.6 06/18/2023   HGB 14.2 06/18/2023   HCT 42.6 06/18/2023   MCV 93.8  06/18/2023   MCH 31.6 01/20/2015   RDW 13.3 06/18/2023   PLT 286.0 06/18/2023   Last metabolic panel Lab Results  Component Value Date   GLUCOSE 90 06/18/2023   NA 140 06/18/2023   K 4.7 06/18/2023   CL 102 06/18/2023   CO2 30 06/18/2023   BUN 14 06/18/2023   CREATININE 1.18 06/18/2023   GFR 65.30 06/18/2023   CALCIUM 9.3 06/18/2023   PROT 6.7 06/18/2023   ALBUMIN 4.3 06/18/2023   BILITOT 0.7 06/18/2023   ALKPHOS 48 06/18/2023   AST 20 06/18/2023   ALT 15 06/18/2023   ANIONGAP 8 01/20/2015   Last lipids Lab Results  Component Value Date   CHOL 138 06/18/2023   HDL 50.30 06/18/2023   LDLCALC 75 06/18/2023   TRIG 65.0 06/18/2023   CHOLHDL 3 06/18/2023   Last thyroid  functions Lab Results  Component Value Date   TSH 1.51 06/18/2023   Last vitamin B12 and Folate Lab Results  Component Value Date   VITAMINB12 362 06/18/2023      The 10-year ASCVD risk score (Arnett DK, et al., 2019) is: 13%    Assessment & Plan:   Problem List Items Addressed This Visit       Unprioritized   Regional enteritis (HCC)   Hypothyroidism   Relevant Orders   TSH   Essential hypertension - Primary   Relevant Orders   Basic metabolic panel with GFR   B12 deficiency   Relevant Orders   Vitamin B12   CBC with Differential/Platelet   Other Visit Diagnoses       Prostate cancer screening       Relevant Orders   PSA, Medicare     Hyperlipidemia, unspecified hyperlipidemia type       Relevant Orders   Lipid panel   Hepatic function panel     Chronic problems as above which are stable.  Obtain screening lab work as above.  Recent acute right foot pain mostly involving MTP joint.  Differential was osteoarthritis versus possible gout.  No prior history of gout.  Symptoms improving.  Watch for any recurrent swelling or redness or warmth.  If he is developing recurrent pain consider x-rays and further labs including eventually uric acid level.  -Prevnar 20 given  -Recommend  continue annual flu vaccine  -Colonoscopy repeat due 2027  No follow-ups on file.    Wolm Scarlet, MD

## 2024-07-28 ENCOUNTER — Encounter: Payer: Self-pay | Admitting: Family Medicine

## 2024-07-28 MED ORDER — LEVOTHYROXINE SODIUM 50 MCG PO TABS: ORAL_TABLET | ORAL | 2 refills | Status: DC

## 2024-08-01 ENCOUNTER — Other Ambulatory Visit: Payer: Self-pay | Admitting: Family Medicine

## 2024-08-02 ENCOUNTER — Other Ambulatory Visit (HOSPITAL_COMMUNITY): Payer: Self-pay

## 2024-08-02 ENCOUNTER — Other Ambulatory Visit: Payer: Self-pay

## 2024-08-02 ENCOUNTER — Other Ambulatory Visit: Payer: Self-pay | Admitting: Family Medicine

## 2024-08-02 MED ORDER — CYANOCOBALAMIN 1000 MCG/ML IJ SOLN
1000.0000 ug | INTRAMUSCULAR | 0 refills | Status: DC
  Filled 2024-08-02: qty 4, 56d supply, fill #0

## 2024-08-02 NOTE — Progress Notes (Signed)
 Needs syringes for his B12 injections.  Will send  Wolm LELON Scarlet MD [facility] at [facility]

## 2024-08-05 ENCOUNTER — Other Ambulatory Visit (HOSPITAL_COMMUNITY): Payer: Self-pay

## 2024-08-05 ENCOUNTER — Other Ambulatory Visit: Payer: Self-pay | Admitting: Family Medicine

## 2024-08-07 ENCOUNTER — Other Ambulatory Visit: Payer: Self-pay

## 2024-08-07 MED ORDER — BD LUER-LOK SYRINGE 25G X 1" 3 ML MISC
1 refills | Status: AC
  Filled 2024-08-07: qty 50, 50d supply, fill #0

## 2024-08-08 ENCOUNTER — Other Ambulatory Visit (HOSPITAL_COMMUNITY): Payer: Self-pay

## 2024-08-15 ENCOUNTER — Other Ambulatory Visit: Payer: Self-pay

## 2024-08-15 MED ORDER — LEVOTHYROXINE SODIUM 50 MCG PO TABS: ORAL_TABLET | ORAL | 2 refills | Status: AC

## 2024-09-29 ENCOUNTER — Ambulatory Visit: Admitting: Family Medicine

## 2024-09-29 ENCOUNTER — Encounter: Payer: Self-pay | Admitting: Family Medicine

## 2024-09-29 VITALS — BP 120/80 | HR 50 | Temp 97.9°F | Wt 203.5 lb

## 2024-09-29 DIAGNOSIS — H9191 Unspecified hearing loss, right ear: Secondary | ICD-10-CM | POA: Diagnosis not present

## 2024-09-29 NOTE — Progress Notes (Signed)
 Established Patient Office Visit  Subjective   Patient ID: Jesus Mccann, male    DOB: Oct 23, 1961  Age: 63 y.o. MRN: [id number]  Chief Complaint  Patient presents with   Ear Pain    HPI   Jesus Mccann is seen with right ear sensation of fullness and somewhat muffled hearing over the past couple weeks.  No drainage.  Denies any recent fevers or chills.  No left ear symptoms.  He is worked around loud noises much of his career and knows he has some bilateral hearing loss but seem to be worse on the right.  He was worried about possible cerumen.  No recent nasal congestion.  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   EXTRACORPOREAL SHOCK WAVE LITHOTRIPSY Left 02/03/2018   Procedure: LEFT EXTRACORPOREAL SHOCK WAVE LITHOTRIPSY (ESWL);  Surgeon: Sherrilee Belvie CROME, MD;  Location: [facility];  Service: Urology;  Laterality: Left;   LAPAROSCOPIC LYSIS INTESTINAL ADHESIONS      reports that he has quit smoking. His smoking use included cigarettes. He has a 3 pack-year smoking history. He has never used smokeless tobacco. He reports current alcohol use. He reports that he does not use drugs. family history includes COPD in his father; Cancer in his mother; Diabetes in his father. Allergies  Allergen Reactions   Losartan  Other (See Comments)    weakness    Review of Systems  Constitutional:  Negative for chills and fever.  HENT:  Positive for hearing loss. Negative for ear discharge, ear pain and tinnitus.       Objective:     BP 120/80   Pulse (!) 50   Temp 97.9 F (36.6 C) (Oral)   Wt 203 lb 8 oz (92.3 kg)   SpO2 97%    BMI 27.26 kg/m  BP Readings from Last 3 Encounters:  09/29/24 120/80  07/25/24 138/70  03/08/24 124/80   Wt Readings from Last 3 Encounters:  09/29/24 203 lb 8 oz (92.3 kg)  07/25/24 210 lb (95.3 kg)  03/08/24 211 lb 4.8 oz (95.8 kg)      Physical Exam Vitals reviewed.  Constitutional:      General: He is not in acute distress.    Appearance: He is not ill-appearing.  HENT:     Ears:     Comments: Cerumen left canal almost totally occluding canal.  Right canal reveals minimal cerumen.  Right eardrum appears normal.  No evidence for effusion.  No evidence for otitis media Neurological:     Mental Status: He is alert.      No results found for any visits on 09/29/24.    The 10-year ASCVD risk score (Arnett DK, et al., 2019) is: 10.6%    Assessment & Plan:   Subacute subjective hearing change right ear.  Does not have enough cerumen to create any significant impact on hearing.  No evidence for effusion.  We did recommend going ahead with irrigation especially in the left ear in preparation for further audiology assessment and recommend setting up audiology referral Both canals were cleared.  Setting up audiology referral for further  assessment  Wolm Scarlet, MD

## 2024-10-13 ENCOUNTER — Other Ambulatory Visit: Payer: Self-pay | Admitting: Medical Genetics

## 2024-10-13 DIAGNOSIS — Z006 Encounter for examination for normal comparison and control in clinical research program: Secondary | ICD-10-CM

## 2024-11-06 ENCOUNTER — Other Ambulatory Visit: Payer: Self-pay | Admitting: Family Medicine

## 2024-11-06 ENCOUNTER — Other Ambulatory Visit (HOSPITAL_BASED_OUTPATIENT_CLINIC_OR_DEPARTMENT_OTHER): Payer: Self-pay

## 2024-11-06 ENCOUNTER — Other Ambulatory Visit: Payer: Self-pay

## 2024-11-06 MED ORDER — CYANOCOBALAMIN 1000 MCG/ML IJ SOLN
1000.0000 ug | INTRAMUSCULAR | 1 refills | Status: AC
  Filled 2024-11-06: qty 4, 56d supply, fill #0

## 2024-11-08 ENCOUNTER — Ambulatory Visit: Attending: Family Medicine | Admitting: Audiologist

## 2024-11-08 DIAGNOSIS — H903 Sensorineural hearing loss, bilateral: Secondary | ICD-10-CM | POA: Insufficient documentation

## 2024-11-08 NOTE — Procedures (Signed)
  Outpatient Audiology and [facility] [address] [place], [place]  [address] [phone]  AUDIOLOGICAL  EVALUATION  NAME: Jesus Mccann     DOB:   02-10-1961      MRN: [id number]                                                                                     DATE: 11/08/2024     REFERENT: Micheal Wolm ORN, MD STATUS: Outpatient DIAGNOSIS: Asymmetric Sensorineural Hearing Loss    History: [name] was seen for an audiological evaluation due to muffled hearing and fullness in his right ear. Burns knows he has some hearing loss from years of noise exposure. He recently started feeling like the right ear is stopped up. [name] is not sure when the difference between the ears started. His last OSHA hearing test was two years ago, he was not told the results. 09/29/24 [name] saw his PCP who irrigated his ears bilaterally. No effusion present. Canals cleared with no visible reason for hearing change.  [name] denies pain or tinnitus today. His hearing in the right ear is the same as in October, does not seem to be rapidly progressing.  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  Medical history shows no additional risk for hearing loss.    Evaluation:  Otoscopy showed a clear view of the tympanic membranes, bilaterally Tympanometry results were consistent with normal middle ear function with symmetric hypercompliance (type Ad) bilaterally   Audiometric testing was completed using Conventional Audiometry techniques with inserts. Test results are consistent with audiogram below. Speech Recognition Thresholds were obtained at  50dB HL in the right ear and at  30dB HL in the left ear. Word Recognition Testing was completed at  40dB SL and Haidar scored 88% in the right ear and 100% in the left.      Results:  The test results were reviewed with [name]. [name] has a mild sloping to moderate sensorineural hearing loss in the left ear with a noise  notch at [facility]. The right ear is asymmetric with a moderate to moderately severe sensorineural  hearing loss 250-8kHz. [name] has normal middle ear function. Hearing loss is in the inner ear. [name] needs to see Otolaryngology due to the asymmetry. Hearing aids discussed but [name] was hesitant to pursue. He needs to see Otolaryngology first.  [name] printed and provided to [name].    Recommendations: Hearing aids recommended for both ears. Patient given list of local hearing aid providers.  Audiometric testing recommended again in three months to monitor right ear hearing loss for progression. If he pursues aids, retest at time of hearing aid fitting. Recommend referral to Otolaryngology due to asymmetric sensorineural hearing loss.    31 minutes spent testing and counseling on results.   If you have any questions please feel free to contact me at [phone].  Lauraine Ka Stalnaker Au.D.  Audiologist   11/08/2024  8:50 AM  Cc: Micheal Wolm ORN, MD

## 2024-11-09 ENCOUNTER — Telehealth: Payer: Self-pay

## 2024-11-09 NOTE — Telephone Encounter (Signed)
 Patient informed.

## 2024-11-09 NOTE — Telephone Encounter (Signed)
-----   Message from Wolm Scarlet sent at 11/09/2024 11:39 AM EST ----- Please let patient know I have placed referral to ENT for asymmetric sensorineural hearing loss. ----- Message ----- From: Librada Lauraine POUR, AUD Sent: 11/08/2024   9:52 AM EST To: Wolm LELON Scarlet, MD  Dr. Scarlet Recommend referral to Otolaryngology due to asymmetric sensorineural  hearing loss. See report for details. He likely will need imaging.  Lauraine Chatters

## 2024-11-09 NOTE — Progress Notes (Signed)
 Let pt know I have ordered ENT referral for asymmetric sensorineural hearing loss.  Wolm LELON Scarlet MD [facility] at [facility]

## 2024-11-09 NOTE — Addendum Note (Signed)
 Addended by: MICHEAL WOLM ORN on: 11/09/2024 11:39 AM   Modules accepted: Orders

## 2024-11-21 ENCOUNTER — Encounter: Payer: Self-pay | Admitting: Family Medicine

## 2024-11-22 NOTE — Telephone Encounter (Signed)
 Noted

## 2024-12-18 ENCOUNTER — Other Ambulatory Visit: Payer: Self-pay | Admitting: Family Medicine

## 2025-01-04 ENCOUNTER — Ambulatory Visit (INDEPENDENT_AMBULATORY_CARE_PROVIDER_SITE_OTHER): Admitting: Physician Assistant

## 2025-01-04 ENCOUNTER — Encounter (INDEPENDENT_AMBULATORY_CARE_PROVIDER_SITE_OTHER): Payer: Self-pay | Admitting: Physician Assistant

## 2025-01-04 VITALS — BP 141/86 | HR 60 | Ht 72.0 in | Wt 210.0 lb

## 2025-01-04 DIAGNOSIS — H90A21 Sensorineural hearing loss, unilateral, right ear, with restricted hearing on the contralateral side: Secondary | ICD-10-CM

## 2025-01-04 DIAGNOSIS — H918X3 Other specified hearing loss, bilateral: Secondary | ICD-10-CM

## 2025-01-04 MED ORDER — DIAZEPAM 2 MG PO TABS: ORAL_TABLET | ORAL | 0 refills | Status: AC

## 2025-01-04 NOTE — Progress Notes (Signed)
 Dear Dr. Micheal, Here is my assessment for our mutual patient, Jesus Mccann. Thank you for allowing me the opportunity to care for your patient. Please do not hesitate to contact me should you have any other questions. Sincerely, Chyrl Cohen PA-C  Otolaryngology Clinic Note Referring provider: Dr. Micheal HPI:  Jesus Mccann is a 64 y.o. male kindly referred by Dr. Micheal   Discussed the use of AI scribe software for clinical note transcription with the patient, who gave verbal consent to proceed.  History of Present Illness    Jesus Mccann is a 64 year old male with bilateral sensorineural hearing loss who presents for evaluation of asymmetric hearing loss.  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 He first noticed a significant asymmetry in October or November 2025, with decreased hearing in the right ear, particularly during phone conversations. Audiometric testing at that time demonstrated significantly worse hearing in the right ear compared to the left.  On the morning of the current visit, he noted new onset crackling noises in the right ear. He denies otalgia, vertigo, or tinnitus.  He has not previously used hearing aids or undergone interventions for hearing loss. 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  He has previously undergone an MRI for an unrelated indication, during which he received a sedative, possibly Valium , for claustrophobia. He denies having a pacemaker.           Independent Review of Additional Tests or Records:  Audio     Results:  The test results were reviewed with Jesus Mccann has a mild sloping to moderate sensorineural hearing loss in the  left ear with a noise notch at Jesus Mccann. The right ear is asymmetric with a moderate to moderately severe sensorineural  hearing loss 250-8kHz. Jesus Mccann has normal middle ear function. Hearing loss is in the inner ear. Jesus Mccann needs to see Otolaryngology due to the asymmetry. Hearing aids discussed but Jesus Mccann was hesitant to pursue. He needs to see Otolaryngology first.  [name] printed and provided to Jesus Mccann.      Recommendations: Hearing aids recommended for both ears. Patient given list of local hearing aid providers.  Audiometric testing recommended again in three months to monitor right ear hearing loss for progression. If he pursues aids, retest at time of hearing aid fitting. Recommend referral to Otolaryngology due to asymmetric sensorineural hearing loss.         PMH/Meds/All/SocHx/FamHx/ROS:   Past Medical History:  Diagnosis Date   CROHN'S DISEASE 01/30/2010   History of kidney stones    HYPERTENSION 01/30/2010   HYPOTHYROIDISM 01/30/2010     Past Surgical History:  Procedure Laterality Date   BOWEL RESECTION  20 yrs ago   resection x2=hx crohns.   EXTRACORPOREAL SHOCK WAVE LITHOTRIPSY Left 02/03/2018   Procedure: LEFT EXTRACORPOREAL SHOCK WAVE LITHOTRIPSY (ESWL);  Surgeon: Sherrilee Belvie CROME, MD;  Location: [facility];  Service: Urology;  Laterality: Left;   LAPAROSCOPIC LYSIS INTESTINAL ADHESIONS      Family History  Problem Relation Age of Onset   Cancer Mother        breast cancer   COPD Father    Diabetes Father    Colon cancer Neg Hx      Social Connections: Unknown (07/23/2024)  Social Advertising Account Executive    Frequency of Communication with Friends and Family: Patient declined    Frequency of Social Gatherings with Friends and Family: Patient declined    Attends Religious Services: Patient declined    Database Administrator or Organizations: Patient declined    Attends Engineer, Structural: Not on file    Marital Status: Married     Current  Medications[1]   Physical Exam:   BP (!) 141/86 (BP Location: Left Arm)   Pulse 60   Ht 6' (1.829 m)   Wt 210 lb (95.3 kg)   SpO2 97%   BMI 28.48 kg/m   Pertinent Findings  CN II-XII grossly intact Bilateral EAC clear and TM intact with well pneumatized middle ear spaces Anterior rhinoscopy: Septum midline; bilateral inferior turbinates with no hypertrophy No lesions of oral cavity/oropharynx; dentition WNL No obviously palpable neck masses/lymphadenopathy/thyromegaly No respiratory distress or stridor       Seprately Identifiable Procedures:  None  Impression & Plans:  Jesus Mccann is a 64 y.o. male with the following   Assessment and Plan    Asymmetric sensorineural hearing loss Subacute asymmetric sensorineural hearing loss, right greater than left, with concern for retrocochlear pathology despite low probability. MRI indicated to exclude neoplasm. Hearing amplification recommended if MRI unremarkable. - Ordered MRI. - Agreed to provide hearing aid clearance form post-MRI. - Prescribed diazepam  for MRI-related anxiety. - Provided after-visit summary with instructions and MRI scheduling contact. - Arranged follow-up via phone for MRI results: staff to notify if normal, provider to call if abnormal. - Advised return for new symptoms or concerns.           - f/u Phone call with results    Thank you for allowing me the opportunity to care for your patient. Please do not hesitate to contact me should you have any other questions.  Sincerely, Chyrl Cohen PA-C [facility] Phone: [phone] Fax: [phone]  01/04/2025, 10:28 AM        [1]  Current Outpatient Medications:    azaTHIOprine  (IMURAN ) 50 MG tablet, TAKE 1 TABLET TWICE A DAY, Disp: 180 tablet, Rfl: 2   Cholecalciferol (VITAMIN D3) 50 MCG (2000 UT) CAPS, Take by mouth. Once a day, Disp: , Rfl:    cyanocobalamin  (VITAMIN B12) 1000 MCG/ML injection, Inject 1 mL (1,000 mcg total)  into the muscle every 14 (fourteen) days., Disp: 4 mL, Rfl: 1   diazepam  (VALIUM ) 2 MG tablet, Take one tab 1 hour prior to MRI as needed for anxiety, Disp: 1 tablet, Rfl: 0   levothyroxine  (SYNTHROID ) 50 MCG tablet, TAKE 1 TABLET ONCE DAILY (NEED AN APPOINTMENT), Disp: 90 tablet, Rfl: 2   SYRINGE-NEEDLE, DISP, 3 ML (B-D 3CC LUER-LOK SYR 25GX1) 25G X 1 3 ML MISC, USE AS DIRECTED, Disp: 50 each, Rfl: 1   valsartan  (DIOVAN ) 160 MG tablet, TAKE 1 TABLET DAILY (NEED AN APPOINTMENT), Disp: 90 tablet, Rfl: 3

## 2025-01-04 NOTE — Patient Instructions (Signed)
 I have ordered an imaging study for you to complete prior to your next visit. Please call [facility] Scheduling at [phone] to schedule your imaging if you have not received a call within 24 hours. If you are unable to complete your imaging study prior to your next scheduled visit please call our office to let us  know.

## 2025-01-09 ENCOUNTER — Institutional Professional Consult (permissible substitution) (INDEPENDENT_AMBULATORY_CARE_PROVIDER_SITE_OTHER)

## 2025-01-22 ENCOUNTER — Institutional Professional Consult (permissible substitution) (INDEPENDENT_AMBULATORY_CARE_PROVIDER_SITE_OTHER)

## 2025-01-31 ENCOUNTER — Ambulatory Visit (HOSPITAL_COMMUNITY)

## 2025-02-06 ENCOUNTER — Ambulatory Visit (HOSPITAL_COMMUNITY)
